# Patient Record
Sex: Male | Born: 1975 | Race: Black or African American | Hispanic: No | Marital: Married | Smoking: Never smoker
Health system: Southern US, Community
[De-identification: ages and names within clinical notes are randomized; demographics above are authoritative.]

## PROBLEM LIST (undated history)

## (undated) DIAGNOSIS — E669 Obesity, unspecified: Secondary | ICD-10-CM

## (undated) DIAGNOSIS — E739 Lactose intolerance, unspecified: Secondary | ICD-10-CM

## (undated) DIAGNOSIS — E785 Hyperlipidemia, unspecified: Secondary | ICD-10-CM

## (undated) DIAGNOSIS — I10 Essential (primary) hypertension: Secondary | ICD-10-CM

## (undated) DIAGNOSIS — R809 Proteinuria, unspecified: Secondary | ICD-10-CM

## (undated) DIAGNOSIS — G44209 Tension-type headache, unspecified, not intractable: Secondary | ICD-10-CM

## (undated) HISTORY — DX: Proteinuria, unspecified: R80.9

## (undated) HISTORY — DX: Essential (primary) hypertension: I10

## (undated) HISTORY — DX: Lactose intolerance, unspecified: E73.9

## (undated) HISTORY — DX: Hyperlipidemia, unspecified: E78.5

## (undated) HISTORY — DX: Tension-type headache, unspecified, not intractable: G44.209

## (undated) HISTORY — DX: Obesity, unspecified: E66.9

---

## 2010-08-21 ENCOUNTER — Encounter (INDEPENDENT_AMBULATORY_CARE_PROVIDER_SITE_OTHER): Payer: BLUE CROSS/BLUE SHIELD | Admitting: Family Medicine

## 2010-08-21 DIAGNOSIS — E669 Obesity, unspecified: Secondary | ICD-10-CM

## 2010-08-21 DIAGNOSIS — Z0000 Encounter for general adult medical examination without abnormal findings: Secondary | ICD-10-CM

## 2010-08-21 DIAGNOSIS — E739 Lactose intolerance, unspecified: Secondary | ICD-10-CM

## 2010-08-21 DIAGNOSIS — R51 Headache: Secondary | ICD-10-CM

## 2011-02-26 ENCOUNTER — Other Ambulatory Visit (INDEPENDENT_AMBULATORY_CARE_PROVIDER_SITE_OTHER): Payer: BLUE CROSS/BLUE SHIELD

## 2011-02-26 DIAGNOSIS — Z111 Encounter for screening for respiratory tuberculosis: Secondary | ICD-10-CM

## 2011-02-28 LAB — TB SKIN TEST: Induration: 0

## 2011-04-02 ENCOUNTER — Encounter: Payer: Self-pay | Admitting: Family Medicine

## 2012-03-04 ENCOUNTER — Encounter (HOSPITAL_COMMUNITY): Payer: Self-pay | Admitting: *Deleted

## 2012-03-04 ENCOUNTER — Emergency Department (HOSPITAL_COMMUNITY)
Admission: EM | Admit: 2012-03-04 | Discharge: 2012-03-04 | Disposition: A | Discharge status: Home / Self Care | Payer: BLUE CROSS/BLUE SHIELD | Attending: Emergency Medicine | Admitting: Emergency Medicine

## 2012-03-04 DIAGNOSIS — J029 Acute pharyngitis, unspecified: Secondary | ICD-10-CM | POA: Insufficient documentation

## 2012-03-04 DIAGNOSIS — E669 Obesity, unspecified: Secondary | ICD-10-CM | POA: Insufficient documentation

## 2012-03-04 MED ORDER — PENICILLIN G BENZATHINE 1200000 UNIT/2ML IM SUSP
1.2000 10*6.[IU] | Freq: Once | INTRAMUSCULAR | Status: AC
  Administered 2012-03-04: 1.2 10*6.[IU] via INTRAMUSCULAR
  Filled 2012-03-04: qty 2

## 2012-03-04 MED ORDER — DEXAMETHASONE SODIUM PHOSPHATE 10 MG/ML IJ SOLN
10.0000 mg | Freq: Once | INTRAMUSCULAR | Status: AC
  Administered 2012-03-04: 10 mg via INTRAMUSCULAR
  Filled 2012-03-04: qty 1

## 2012-03-04 MED ORDER — DIPHENHYDRAMINE HCL 50 MG/ML IJ SOLN
25.0000 mg | Freq: Once | INTRAMUSCULAR | Status: AC
  Administered 2012-03-04: 25 mg via INTRAMUSCULAR
  Filled 2012-03-04: qty 1

## 2012-03-04 NOTE — ED Provider Notes (Signed)
History     CSN: [id number]  Arrival date & time 03/04/12  4098   First MD Initiated Contact with Patient 03/04/12 0539      Chief Complaint  Patient presents with  . Sore Throat    (Consider location/radiation/quality/duration/timing/severity/associated sxs/prior treatment) Patient is a 36 y.o. male presenting with pharyngitis. The history is provided by the patient.  Sore Throat This is a new problem. The current episode started 2 days ago. The problem occurs constantly. The problem has not changed since onset.The symptoms are aggravated by swallowing.    Past Medical History  Diagnosis Date  . Obesity   . Lactose intolerance   . Tension headache     History reviewed. No pertinent past surgical history.  No family history on file.  History  Substance Use Topics  . Smoking status: Never Smoker   . Smokeless tobacco: Not on file  . Alcohol Use: Yes     occasionally      Review of Systems  HENT: Negative for neck pain and neck stiffness.   All other systems reviewed and are negative.    Allergies  Review of patient's allergies indicates no known allergies.  Home Medications  No current outpatient prescriptions on file.  BP 158/94  Pulse 86  Temp 98.1 F (36.7 C) (Oral)  Resp 18  SpO2 97%  Physical Exam  Vitals reviewed. Constitutional: He is oriented to person, place, and time. He appears well-developed and well-nourished.  HENT:  Head: Normocephalic and atraumatic.  Eyes: Conjunctivae normal are normal. Pupils are equal, round, and reactive to light.  Neck: Normal range of motion. Neck supple. No spinous process tenderness and no muscular tenderness present. Normal range of motion present. No Brudzinski's sign and no Kernig's sign noted.  Cardiovascular: Normal rate, regular rhythm, normal heart sounds and intact distal pulses.   Pulmonary/Chest: Effort normal and breath sounds normal.  Abdominal: Soft. Bowel sounds are normal.  Neurological: He  is alert and oriented to person, place, and time.  Skin: Skin is warm and dry.  Psychiatric: He has a normal mood and affect. His behavior is normal. Judgment and thought content normal.    ED Course  Procedures (including critical care time)   Labs Reviewed  RAPID STREP SCREEN   No results found.   1. Pharyngitis       MDM  + pharyngitis.  No difficulty phonating or swallowing.  No meningeal sxs.  No uvular deviation.  Will abx,  Steriods,  Dc to fu ret new/worsening sxs        Blakleigh Straw Lytle Michaels, MD 03/04/12 0600

## 2012-03-04 NOTE — ED Notes (Signed)
Pt c/o sore throat, headache and fever of102 since Sunday (presently afebrile) Pain is unrelieved by ibuprofen and tylenol.  Pt states he started coughing today.

## 2012-06-01 ENCOUNTER — Ambulatory Visit (INDEPENDENT_AMBULATORY_CARE_PROVIDER_SITE_OTHER): Payer: BLUE CROSS/BLUE SHIELD | Admitting: Medical

## 2012-06-01 ENCOUNTER — Encounter: Payer: Self-pay | Admitting: Medical

## 2012-06-01 VITALS — BP 170/100 | HR 60 | Temp 97.7°F | Resp 16 | Wt 300.0 lb

## 2012-06-01 DIAGNOSIS — H109 Unspecified conjunctivitis: Secondary | ICD-10-CM

## 2012-06-01 DIAGNOSIS — R030 Elevated blood-pressure reading, without diagnosis of hypertension: Secondary | ICD-10-CM

## 2012-06-01 MED ORDER — ERYTHROMYCIN 5 MG/GM OP OINT: TOPICAL_OINTMENT | Freq: Four times a day (QID) | OPHTHALMIC | Status: DC

## 2012-06-01 NOTE — Patient Instructions (Signed)
Conjunctivitis Conjunctivitis is commonly called "pink eye." Conjunctivitis can be caused by bacterial or viral infection, allergies, or injuries. There is usually redness of the lining of the eye, itching, discomfort, and sometimes discharge. There may be deposits of matter along the eyelids. A viral infection usually causes a watery discharge, while a bacterial infection causes a yellowish, thick discharge. Pink eye is very contagious and spreads by direct contact. You may be given antibiotic eyedrops as part of your treatment. Before using your eye medicine, remove all drainage from the eye by washing gently with warm water and cotton balls. Continue to use the medication until you have awakened 2 mornings in a row without discharge from the eye. Do not rub your eye. This increases the irritation and helps spread infection. Use separate towels from other household members. Wash your hands with soap and water before and after touching your eyes. Use cold compresses to reduce pain and sunglasses to relieve irritation from light. Do not wear contact lenses or wear eye makeup until the infection is gone. SEEK MEDICAL CARE IF:   Your symptoms are not better after 3 days of treatment.  You have increased pain or trouble seeing.  The outer eyelids become very red or swollen. Document Released: 06/06/2004 Document Revised: 07/22/2011 Document Reviewed: 04/29/2005 Exit[employer] Patient Information 2013 [employer].  

## 2012-06-01 NOTE — Progress Notes (Signed)
Subjective: Here for right eye issue x 2 days.  He reports redness, swelling, mucous drainage, itching, crusting and matted all day long, very crusted.  Having to pry open in the morning.  No sick contacts with similar.  Child is in daycare, but they haven't had similar.  Using moist compresses.  Vision is blurry, but otherwise fine.   No other aggravating or relieving factors.  No other c/o.  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  Past Medical History  Diagnosis Date  . Obesity   . Lactose intolerance   . Tension headache    Review of Systems Constitutional: -fever, -chills, -sweats, -fatigue ENT: -runny nose, -ear pain, -sore throat Cardiology:  -chest pain, -palpitations, -edema Respiratory: -cough, -shortness of breath, -wheezing Gastroenterology: -abdominal pain, -nausea, -vomiting, -diarrhea, -constipation  Urology: -dysuria, -difficulty urinating, -hematuria, -urinary frequency, -urgency Neurology: -headache, -weakness, -tingling, -numbness   Objective: Gen: wd, wn, nad Skin; warm, dry Eyes, right conjunctiva injected, crusting of eyelids, no eye lid swelling or erythema, PERRLA, EOMi, left eye normal exam ENT: nares patent, no turbinate swelling or discharge, TMs pearly, pharynx normal Neck: supple, nontender, no mass or thyromegaly, no lymphadenopathy   Assessment: Encounter Diagnoses  Name Primary?  . Elevated blood pressure reading without diagnosis of hypertension Yes  . Conjunctivitis    Elevated BP today and on another prior visit.  Advised he return soon to discuss and possibly may need therapy.  Discussed stress reduction  conjunctivitis - discussed diagnosis and treatment, precautions, hand washing.   Begin erythromycin ointment, moist compresses, gave note for work.     Return in 1-2wk recheck on BP.

## 2013-02-10 ENCOUNTER — Ambulatory Visit (INDEPENDENT_AMBULATORY_CARE_PROVIDER_SITE_OTHER): Payer: BC Managed Care – PPO | Admitting: Medical

## 2013-02-10 ENCOUNTER — Encounter: Payer: Self-pay | Admitting: Medical

## 2013-02-10 VITALS — BP 128/80 | HR 68 | Temp 98.2°F | Resp 16 | Ht 72.0 in | Wt 304.0 lb

## 2013-02-10 DIAGNOSIS — E669 Obesity, unspecified: Secondary | ICD-10-CM

## 2013-02-10 DIAGNOSIS — Z0000 Encounter for general adult medical examination without abnormal findings: Secondary | ICD-10-CM

## 2013-02-10 LAB — CBC WITH DIFFERENTIAL/PLATELET
Basophils Absolute: 0 10*3/uL (ref 0.0–0.1)
Basophils Relative: 0 % (ref 0–1)
Eosinophils Absolute: 0.2 10*3/uL (ref 0.0–0.7)
Eosinophils Relative: 2 % (ref 0–5)
Hemoglobin: 13.5 g/dL (ref 13.0–17.0)
MCH: 29.3 pg (ref 26.0–34.0)
MCV: 83.1 fL (ref 78.0–100.0)
Monocytes Relative: 9 % (ref 3–12)
Neutrophils Relative %: 44 % (ref 43–77)
Platelets: 248 10*3/uL (ref 150–400)
RBC: 4.61 MIL/uL (ref 4.22–5.81)
RDW: 14.7 % (ref 11.5–15.5)

## 2013-02-10 LAB — COMPREHENSIVE METABOLIC PANEL
ALT: 14 U/L (ref 0–53)
Albumin: 4.1 g/dL (ref 3.5–5.2)
Alkaline Phosphatase: 65 U/L (ref 39–117)
BUN: 15 mg/dL (ref 6–23)
CO2: 28 mEq/L (ref 19–32)
Calcium: 9.5 mg/dL (ref 8.4–10.5)
Potassium: 3.7 mEq/L (ref 3.5–5.3)
Sodium: 137 mEq/L (ref 135–145)
Total Bilirubin: 0.5 mg/dL (ref 0.3–1.2)
Total Protein: 7.9 g/dL (ref 6.0–8.3)

## 2013-02-10 LAB — POCT URINALYSIS DIPSTICK
Bilirubin, UA: NEGATIVE
Glucose, UA: NEGATIVE
Ketones, UA: NEGATIVE
Leukocytes, UA: NEGATIVE
Spec Grav, UA: 1.02
Urobilinogen, UA: NEGATIVE
pH, UA: 5

## 2013-02-10 LAB — TSH: TSH: 0.957 u[IU]/mL (ref 0.350–4.500)

## 2013-02-10 LAB — LIPID PANEL: LDL Cholesterol: 144 mg/dL — ABNORMAL HIGH (ref 0–99)

## 2013-02-10 MED ORDER — LORCASERIN HCL 10 MG PO TABS: 1.0000 | ORAL_TABLET | Freq: Two times a day (BID) | ORAL | Status: DC

## 2013-02-10 NOTE — Progress Notes (Signed)
Subjective:   HPI  Jon Hansen is a 37 y.o. male who presents for a complete physical.  Preventative care: Last ophthalmology visit:n/a Last dental visit:n/a Last colonoscopy:n/a Last prostate exam: n/a Last EKG:7 to 8 years ago Last labs: 02/2012  Prior vaccinations: TD or Tdap:6 years ago for his job Influenza:no to flu vaccine Pneumococcal:n/a Shingles/Zostavax:n/a  Advanced directive:n/a Health care power of attorney:n/a Living will:n/a  Concerns: Few months ago had a few episodes of chest pain or palpitations lasting for seconds without associated symptoms, but this was with stressful job and eating worse, drinking more caffeine.  Since leaving the job, cutting back on caffeine, has had no more symptoms.   Reviewed their medical, surgical, family, social, medication, and allergy history and updated chart as appropriate.  Past Medical History  Diagnosis Date  . Obesity   . Lactose intolerance   . Tension headache     resolved; prior with stressful job    History reviewed. No pertinent past surgical history.  History   Social History  . Marital Status: Married    Spouse Name: N/A    Number of Children: N/A  . Years of Education: N/A   Occupational History  . Not on file.   Social History Main Topics  . Smoking status: Never Smoker   . Smokeless tobacco: Not on file  . Alcohol Use: 0.6 oz/week    1 Shots of liquor per week     Comment: occasionally  . Drug Use: No  . Sexual Activity: Not on file   Other Topics Concern  . Not on file   Social History Narrative   High school science teacher, limited exercise - walking, parking further from entrances;  Married - no biological children.  Christian    Family History  Problem Relation Age of Onset  . Diabetes Mother   . Diabetes Father   . Hypertension Maternal Grandmother   . Cancer Neg Hx   . Stroke Neg Hx   . Heart disease Neg Hx     Current outpatient prescriptions:ibuprofen  (ADVIL,MOTRIN) 200 MG tablet, Take 200-600 mg by mouth every 6 (six) hours as needed. pain, Disp: , Rfl:   No Known Allergies     Review of Systems Constitutional: -fever, -chills, -sweats, -unexpected weight change, -decreased appetite, -fatigue Allergy: -sneezing, -itching, -congestion Dermatology: -changing moles, --rash, -lumps ENT: -runny nose, -ear pain, -sore throat, -hoarseness, -sinus pain, -teeth pain, - ringing in ears, -hearing loss, -nosebleeds Cardiology: -chest pain, -palpitations, -swelling, -difficulty breathing when lying flat, -waking up short of breath Respiratory: -cough, -shortness of breath, -difficulty breathing with exercise or exertion, -wheezing, -coughing up blood Gastroenterology: -abdominal pain, -nausea, -vomiting, -diarrhea, -constipation, -blood in stool, -changes in bowel movement, -difficulty swallowing or eating Hematology: -bleeding, -bruising  Musculoskeletal: -joint aches, -muscle aches, -joint swelling, -back pain, -neck pain, -cramping, -changes in gait Ophthalmology: denies vision changes, eye redness, itching, discharge Urology: -burning with urination, -difficulty urinating, -blood in urine, -urinary frequency, -urgency, -incontinence Neurology: -headache, -weakness, +tingling, -numbness, -memory loss, -falls, -dizziness Psychology: -depressed mood, -agitation, -sleep problems     Objective:   Physical Exam  BP 128/80  Pulse 68  Temp(Src) 98.2 F (36.8 C) (Oral)  Resp 16  Ht 6' (1.829 m)  Wt 304 lb (137.893 kg)  BMI 41.22 kg/m2  General appearance: alert, no distress, WD/WN, AA male Skin: few scattered skin tags, no other worrisome lesions HEENT: normocephalic, conjunctiva/corneas normal, sclerae anicteric, PERRLA, EOMi, nares patent, no discharge or erythema, pharynx  normal Oral cavity: MMM, tongue normal, teeth in good repair Neck: supple, no lymphadenopathy, no thyromegaly, no masses, normal ROM, no bruits Chest: non tender,  normal shape and expansion Heart: RRR, normal S1, S2, no murmurs Lungs: CTA bilaterally, no wheezes, rhonchi, or rales Abdomen: +bs, soft, non tender, non distended, no masses, no hepatomegaly, no splenomegaly, no bruits Back: non tender, normal ROM, no scoliosis Musculoskeletal: upper extremities non tender, no obvious deformity, normal ROM throughout, lower extremities non tender, no obvious deformity, normal ROM throughout Extremities: no edema, no cyanosis, no clubbing Pulses: 2+ symmetric, upper and lower extremities, normal cap refill Neurological: alert, oriented x 3, CN2-12 intact, strength normal upper extremities and lower extremities, sensation normal throughout, DTRs 2+ throughout, no cerebellar signs, gait normal Psychiatric: normal affect, behavior normal, pleasant  GU: normal male external genitalia, circumcised, brown linear flat macular streak down mid scrotum unchanged for years per pt, nontender, no masses, no hernia, no lymphadenopathy Rectal: deferred   Assessment and Plan :      Encounter Diagnoses  Name Primary?  . Routine general medical examination at a health care facility Yes  . Obesity, unspecified     Physical exam - discussed healthy lifestyle, diet, exercise, preventative care, vaccinations, and addressed their concerns.  See dentist soon.  Completed his foster parent forms. He has had 2 children prior that were ultimately reunited with their siblings.  They are activity seeking children now.  Obesity -  Discussed risks of obesity, prior elevated BP without hypertension, discussed healthy diet, incresaed exercising, discussed tracking diet with smart phone app, begin belviq to help aid weight loss.  Discussed risks/benefits of medication.  Recheck 94mo.  Follow-up for fasting labs.

## 2013-02-10 NOTE — Patient Instructions (Signed)
Begin Belviq appetite suppressant to help with weight loss efforts.  Use My Fitness Pal to track diet.  Limit calories to 1800 calories per day.  Eat 4-6 small portions daily.  Increase exercise frequency.  Recheck in 1-2 months.  Call sooner if any problems.   See dentist soon.    Preventative Care for Adults, Male       REGULAR HEALTH EXAMS:  A routine yearly physical is a good way to check in with your primary care provider about your health and preventive screening. It is also an opportunity to share updates about your health and any concerns you have, and receive a thorough all-over exam.   Most health insurance companies pay for at least some preventative services.  Check with your health plan for specific coverages.  WHAT PREVENTATIVE SERVICES DO MEN NEED?  Adult men should have their weight and blood pressure checked regularly.   Men age 34 and older should have their cholesterol levels checked regularly.  Beginning at age 73 and continuing to age 29, men should be screened for colorectal cancer.  Certain people should may need continued testing until age 66.  Other cancer screening may include exams for testicular and prostate cancer.  Updating vaccinations is part of preventative care.  Vaccinations help protect against diseases such as the flu.  Lab tests are generally done as part of preventative care to screen for anemia and blood disorders, to screen for problems with the kidneys and liver, to screen for bladder problems, to check blood sugar, and to check your cholesterol level.  Preventative services generally include counseling about diet, exercise, avoiding tobacco, drugs, excessive alcohol consumption, and sexually transmitted infections.    GENERAL RECOMMENDATIONS FOR GOOD HEALTH:  Healthy diet:  Eat a variety of foods, including fruit, vegetables, animal or vegetable protein, such as meat, fish, chicken, and eggs, or beans, lentils, tofu, and grains, such as  rice.  Drink plenty of water daily.  Decrease saturated fat in the diet, avoid lots of red meat, processed foods, sweets, fast foods, and fried foods.  Exercise:  Aerobic exercise helps maintain good heart health. At least 30-40 minutes of moderate-intensity exercise is recommended. For example, a brisk walk that increases your heart rate and breathing. This should be done on most days of the week.   Find a type of exercise or a variety of exercises that you enjoy so that it becomes a part of your daily life.  Examples are running, walking, swimming, water aerobics, and biking.  For motivation and support, explore group exercise such as aerobic class, spin class, Zumba, Yoga,or  martial arts, etc.    Set exercise goals for yourself, such as a certain weight goal, walk or run in a race such as a 5k walk/run.  Speak to your primary care provider about exercise goals.  Disease prevention:  If you smoke or chew tobacco, find out from your caregiver how to quit. It can literally save your life, no matter how long you have been a tobacco user. If you do not use tobacco, never begin.   Maintain a healthy diet and normal weight. Increased weight leads to problems with blood pressure and diabetes.   The Body Mass Index or BMI is a way of measuring how much of your body is fat. Having a BMI above 27 increases the risk of heart disease, diabetes, hypertension, stroke and other problems related to obesity. Your caregiver can help determine your BMI and based on it develop an  exercise and dietary program to help you achieve or maintain this important measurement at a healthful level.  High blood pressure causes heart and blood vessel problems.  Persistent high blood pressure should be treated with medicine if weight loss and exercise do not work.   Fat and cholesterol leaves deposits in your arteries that can block them. This causes heart disease and vessel disease elsewhere in your body.  If your  cholesterol is found to be high, or if you have heart disease or certain other medical conditions, then you may need to have your cholesterol monitored frequently and be treated with medication.   Ask if you should have a stress test if your history suggests this. A stress test is a test done on a treadmill that looks for heart disease. This test can find disease prior to there being a problem.  Avoid drinking alcohol in excess (more than two drinks per day).  Avoid use of street drugs. Do not share needles with anyone. Ask for professional help if you need assistance or instructions on stopping the use of alcohol, cigarettes, and/or drugs.  Brush your teeth twice a day with fluoride toothpaste, and floss once a day. Good oral hygiene prevents tooth decay and gum disease. The problems can be painful, unattractive, and can cause other health problems. Visit your dentist for a routine oral and dental check up and preventive care every 6-12 months.   Look at your skin regularly.  Use a mirror to look at your back. Notify your caregivers of changes in moles, especially if there are changes in shapes, colors, a size larger than a pencil eraser, an irregular border, or development of new moles.  Safety:  Use seatbelts 100% of the time, whether driving or as a passenger.  Use safety devices such as hearing protection if you work in environments with loud noise or significant background noise.  Use safety glasses when doing any work that could send debris in to the eyes.  Use a helmet if you ride a bike or motorcycle.  Use appropriate safety gear for contact sports.  Talk to your caregiver about gun safety.  Use sunscreen with a SPF (or skin protection factor) of 15 or greater.  Lighter skinned people are at a greater risk of skin cancer. Don't forget to also wear sunglasses in order to protect your eyes from too much damaging sunlight. Damaging sunlight can accelerate cataract formation.   Practice safe  sex. Use condoms. Condoms are used for birth control and to help reduce the spread of sexually transmitted infections (or STIs).  Some of the STIs are gonorrhea (the clap), chlamydia, syphilis, trichomonas, herpes, HPV (human papilloma virus) and HIV (human immunodeficiency virus) which causes AIDS. The herpes, HIV and HPV are viral illnesses that have no cure. These can result in disability, cancer and death.   Keep carbon monoxide and smoke detectors in your home functioning at all times. Change the batteries every 6 months or use a model that plugs into the wall.   Vaccinations:  Stay up to date with your tetanus shots and other required immunizations. You should have a booster for tetanus every 10 years. Be sure to get your flu shot every year, since 5%-20% of the [place] population comes down with the flu. The flu vaccine changes each year, so being vaccinated once is not enough. Get your shot in the fall, before the flu season peaks.   Other vaccines to consider:  Pneumococcal vaccine to protect against certain  types of pneumonia.  This is normally recommended for adults age 45 or older.  However, adults younger than 35 years old with certain underlying conditions such as diabetes, heart or lung disease should also receive the vaccine.  Shingles vaccine to protect against Varicella Zoster if you are older than age 3, or younger than 35 years old with certain underlying illness.  Hepatitis A vaccine to protect against a form of infection of the liver by a virus acquired from food.  Hepatitis B vaccine to protect against a form of infection of the liver by a virus acquired from blood or body fluids, particularly if you work in health care.  If you plan to travel internationally, check with your local health department for specific vaccination recommendations.  Cancer Screening:  Most routine colon cancer screening begins at the age of 40. On a yearly basis, doctors may provide special easy to  use take-home tests to check for hidden blood in the stool. Sigmoidoscopy or colonoscopy can detect the earliest forms of colon cancer and is life saving. These tests use a small camera at the end of a tube to directly examine the colon. Speak to your caregiver about this at age 79, when routine screening begins (and is repeated every 5 years unless early forms of pre-cancerous polyps or small growths are found).   At the age of 89 men usually start screening for prostate cancer every year. Screening may begin at a younger age for those with higher risk. Those at higher risk include African-Americans or having a family history of prostate cancer. There are two types of tests for prostate cancer:   Prostate-specific antigen (PSA) testing. Recent studies raise questions about prostate cancer using PSA and you should discuss this with your caregiver.   Digital rectal exam (in which your doctor's lubricated and gloved finger feels for enlargement of the prostate through the anus).   Screening for testicular cancer.  Do a monthly exam of your testicles. Gently roll each testicle between your thumb and fingers, feeling for any abnormal lumps. The best time to do this is after a hot shower or bath when the tissues are looser. Notify your caregivers of any lumps, tenderness or changes in size or shape immediately.

## 2013-04-14 ENCOUNTER — Other Ambulatory Visit (INDEPENDENT_AMBULATORY_CARE_PROVIDER_SITE_OTHER): Payer: BC Managed Care – PPO

## 2013-04-14 DIAGNOSIS — Z111 Encounter for screening for respiratory tuberculosis: Secondary | ICD-10-CM

## 2013-04-16 LAB — TB SKIN TEST: TB Skin Test: NEGATIVE

## 2013-04-19 ENCOUNTER — Other Ambulatory Visit: Payer: Self-pay

## 2013-04-20 ENCOUNTER — Other Ambulatory Visit (INDEPENDENT_AMBULATORY_CARE_PROVIDER_SITE_OTHER): Payer: BC Managed Care – PPO

## 2013-04-20 DIAGNOSIS — Z23 Encounter for immunization: Secondary | ICD-10-CM

## 2014-03-10 ENCOUNTER — Ambulatory Visit (INDEPENDENT_AMBULATORY_CARE_PROVIDER_SITE_OTHER): Payer: BC Managed Care – PPO | Admitting: Family Medicine

## 2014-03-10 ENCOUNTER — Ambulatory Visit: Payer: BC Managed Care – PPO | Admitting: Family Medicine

## 2014-03-10 ENCOUNTER — Encounter: Payer: Self-pay | Admitting: Family Medicine

## 2014-03-10 VITALS — BP 174/100 | HR 72 | Ht 71.0 in | Wt 302.0 lb

## 2014-03-10 DIAGNOSIS — R809 Proteinuria, unspecified: Secondary | ICD-10-CM

## 2014-03-10 DIAGNOSIS — R7989 Other specified abnormal findings of blood chemistry: Secondary | ICD-10-CM

## 2014-03-10 DIAGNOSIS — I10 Essential (primary) hypertension: Secondary | ICD-10-CM

## 2014-03-10 DIAGNOSIS — E7800 Pure hypercholesterolemia, unspecified: Secondary | ICD-10-CM

## 2014-03-10 DIAGNOSIS — R748 Abnormal levels of other serum enzymes: Secondary | ICD-10-CM

## 2014-03-10 LAB — POCT URINALYSIS DIPSTICK
Bilirubin, UA: NEGATIVE
Glucose, UA: NEGATIVE
KETONES UA: NEGATIVE
Leukocytes, UA: NEGATIVE
NITRITE UA: NEGATIVE
PH UA: 6
Spec Grav, UA: 1.015
Urobilinogen, UA: NEGATIVE

## 2014-03-10 LAB — COMPREHENSIVE METABOLIC PANEL
ALK PHOS: 63 U/L (ref 39–117)
ALT: 15 U/L (ref 0–53)
AST: 15 U/L (ref 0–37)
Albumin: 3.9 g/dL (ref 3.5–5.2)
BILIRUBIN TOTAL: 0.5 mg/dL (ref 0.2–1.2)
BUN: 13 mg/dL (ref 6–23)
CO2: 28 mEq/L (ref 19–32)
CREATININE: 1.44 mg/dL — AB (ref 0.50–1.35)
Calcium: 8.8 mg/dL (ref 8.4–10.5)
Chloride: 102 mEq/L (ref 96–112)
Glucose, Bld: 98 mg/dL (ref 70–99)
Potassium: 3.7 mEq/L (ref 3.5–5.3)
Sodium: 138 mEq/L (ref 135–145)
Total Protein: 7.2 g/dL (ref 6.0–8.3)

## 2014-03-10 MED ORDER — AMLODIPINE BESYLATE 10 MG PO TABS: 10.0000 mg | ORAL_TABLET | Freq: Every day | ORAL | Status: DC

## 2014-03-10 NOTE — Patient Instructions (Signed)
Hypertension °Hypertension, commonly called high blood pressure, is when the force of blood pumping through your arteries is too strong. Your arteries are the blood vessels that carry blood from your heart throughout your body. A blood pressure reading consists of a higher number over a lower number, such as 110/72. The higher number (systolic) is the pressure inside your arteries when your heart pumps. The lower number (diastolic) is the pressure inside your arteries when your heart relaxes. Ideally you want your blood pressure below 120/80. °Hypertension forces your heart to work harder to pump blood. Your arteries may become narrow or stiff. Having hypertension puts you at risk for heart disease, stroke, and other problems.  °RISK FACTORS °Some risk factors for high blood pressure are controllable. Others are not.  °Risk factors you cannot control include:  °· Race. You may be at higher risk if you are African American. °· Age. Risk increases with age. °· Gender. Men are at higher risk than women before age 45 years. After age 65, women are at higher risk than men. °Risk factors you can control include: °· Not getting enough exercise or physical activity. °· Being overweight. °· Getting too much fat, sugar, calories, or salt in your diet. °· Drinking too much alcohol. °SIGNS AND SYMPTOMS °Hypertension does not usually cause signs or symptoms. Extremely high blood pressure (hypertensive crisis) may cause headache, anxiety, shortness of breath, and nosebleed. °DIAGNOSIS  °To check if you have hypertension, your health care provider will measure your blood pressure while you are seated, with your arm held at the level of your heart. It should be measured at least twice using the same arm. Certain conditions can cause a difference in blood pressure between your right and left arms. A blood pressure reading that is higher than normal on one occasion does not mean that you need treatment. If one blood pressure reading  is high, ask your health care provider about having it checked again. °TREATMENT  °Treating high blood pressure includes making lifestyle changes and possibly taking medicine. Living a healthy lifestyle can help lower high blood pressure. You may need to change some of your habits. °Lifestyle changes may include: °· Following the DASH diet. This diet is high in fruits, vegetables, and whole grains. It is low in salt, red meat, and added sugars. °· Getting at least 2½ hours of brisk physical activity every week. °· Losing weight if necessary. °· Not smoking. °· Limiting alcoholic beverages. °· Learning ways to reduce stress. ° If lifestyle changes are not enough to get your blood pressure under control, your health care provider may prescribe medicine. You may need to take more than one. Work closely with your health care provider to understand the risks and benefits. °HOME CARE INSTRUCTIONS °· Have your blood pressure rechecked as directed by your health care provider.   °· Take medicines only as directed by your health care provider. Follow the directions carefully. Blood pressure medicines must be taken as prescribed. The medicine does not work as well when you skip doses. Skipping doses also puts you at risk for problems.   °· Do not smoke.   °· Monitor your blood pressure at home as directed by your health care provider.  °SEEK MEDICAL CARE IF:  °· You think you are having a reaction to medicines taken. °· You have recurrent headaches or feel dizzy. °· You have swelling in your ankles. °· You have trouble with your vision. °SEEK IMMEDIATE MEDICAL CARE IF: °· You develop a severe headache or confusion. °·   You have unusual weakness, numbness, or feel faint. °· You have severe chest or abdominal pain. °· You vomit repeatedly. °· You have trouble breathing. °MAKE SURE YOU:  °· Understand these instructions. °· Will watch your condition. °· Will get help right away if you are not doing well or get worse. °Document  Released: 04/29/2005 Document Revised: 09/13/2013 Document Reviewed: 02/19/2013 °Exit[employer]® Patient Information ©2015 [employer]. This information is not intended to replace advice given to you by your health care provider. Make sure you discuss any questions you have with your health care provider. °Low-Sodium Eating Plan °Sodium raises blood pressure and causes water to be held in the body. Getting less sodium from food will help lower your blood pressure, reduce any swelling, and protect your heart, liver, and kidneys. We get sodium by adding salt (sodium chloride) to food. Most of our sodium comes from canned, boxed, and frozen foods. Restaurant foods, fast foods, and pizza are also very high in sodium. Even if you take medicine to lower your blood pressure or to reduce fluid in your body, getting less sodium from your food is important. °WHAT IS MY PLAN? °Most people should limit their sodium intake to 2,300 mg a day. Your health care provider recommends that you limit your sodium intake to __________ a day.  °WHAT DO I NEED TO KNOW ABOUT THIS EATING PLAN? °For the low-sodium eating plan, you will follow these general guidelines: °· Choose foods with a % Daily Value for sodium of less than 5% (as listed on the food label).   °· Use salt-free seasonings or herbs instead of table salt or sea salt.   °· Check with your health care provider or pharmacist before using salt substitutes.   °· Eat fresh foods. °· Eat more vegetables and fruits. °· Limit canned vegetables. If you do use them, rinse them well to decrease the sodium.   °· Limit cheese to 1 oz (28 g) per day.    °· Eat lower-sodium products, often labeled as "lower sodium" or "no salt added." °· Avoid foods that contain monosodium glutamate (MSG). MSG is sometimes added to Chinese food and some canned foods.   °· Check food labels (Nutrition Facts labels) on foods to learn how much sodium is in one serving. °· Eat more home-cooked food and less  restaurant, buffet, and fast food.  °· When eating at a restaurant, ask that your food be prepared with less salt or none, if possible.   °HOW DO I READ FOOD LABELS FOR SODIUM INFORMATION? °The Nutrition Facts label lists the amount of sodium in one serving of the food. If you eat more than one serving, you must multiply the listed amount of sodium by the number of servings. °Food labels may also identify foods as: °· Sodium free--Less than 5 mg in a serving. °· Very low sodium--35 mg or less in a serving. °· Low sodium--140 mg or less in a serving. °· Light in sodium--50% less sodium in a serving. For example, if a food that usually has 300 mg of sodium is changed to become light in sodium, it will have 150 mg of sodium. °· Reduced sodium--25% less sodium in a serving. For example, if a food that usually has 400 mg of sodium is changed to reduced sodium, it will have 300 mg of sodium. °WHAT FOODS CAN I EAT? °Grains  °Low-sodium cereals, including oats, puffed wheat and rice, and shredded wheat cereals. Low-sodium crackers. Unsalted rice and pasta. Lower-sodium bread.  °Vegetables  °Frozen or fresh vegetables. Low-sodium or reduced-sodium canned vegetables. Low-sodium   or reduced-sodium tomato sauce and paste. Low-sodium or reduced-sodium tomato and vegetable juices.  °Fruits  °Fresh, frozen, and canned fruit. Fruit juice.  °Meat and Other Protein Products  °Low-sodium canned tuna and salmon. Fresh or frozen meat, poultry, seafood, and fish. Lamb. Unsalted nuts. Dried beans, peas, and lentils without added salt. Unsalted canned beans. Homemade soups without salt. Eggs.  °Dairy  °Milk. Soy milk. Ricotta cheese. Low-sodium or reduced-sodium cheeses. Yogurt.  °Condiments  °Fresh and dried herbs and spices. Salt-free seasonings. Onion and garlic powders. Low-sodium varieties of mustard and ketchup. Lemon juice.  °Fats and Oils   °Reduced-sodium salad dressings. Unsalted butter.   °Other  °Unsalted popcorn and pretzels.    °The items listed above may not be a complete list of recommended foods or beverages. Contact your dietitian for more options. °WHAT FOODS ARE NOT RECOMMENDED? °Grains  °Instant hot cereals. Bread stuffing, pancake, and biscuit mixes. Croutons. Seasoned rice or pasta mixes. Noodle soup cups. Boxed or frozen macaroni and cheese. Self-rising flour. Regular salted crackers. °Vegetables  °Regular canned vegetables. Regular canned tomato sauce and paste. Regular tomato and vegetable juices. Frozen vegetables in sauces. Salted french fries. Olives. Pickles. Relishes. Sauerkraut. Salsa. °Meat and Other Protein Products  °Salted, canned, smoked, spiced, or pickled meats, seafood, or fish. Bacon, ham, sausage, hot dogs, corned beef, chipped beef, and packaged luncheon meats. Salt pork. Jerky. Pickled herring. Anchovies, regular canned tuna, and sardines. Salted nuts. °Dairy  °Processed cheese and cheese spreads. Cheese curds. Blue cheese and cottage cheese. Buttermilk.  °Condiments  °Onion and garlic salt, seasoned salt, table salt, and sea salt. Canned and packaged gravies. Worcestershire sauce. Tartar sauce. Barbecue sauce. Teriyaki sauce. Soy sauce, including reduced sodium. Steak sauce. Fish sauce. Oyster sauce. Cocktail sauce. Horseradish. Regular ketchup and mustard. Meat flavorings and tenderizers. Bouillon cubes. Hot sauce. Tabasco sauce. Marinades. Taco seasonings. Relishes. °Fats and Oils   °Regular salad dressings. Salted butter. Margarine. Ghee. Bacon fat.  °Other  °Potato and tortilla chips. Corn chips and puffs. Salted popcorn and pretzels. Canned or dried soups. Pizza. Frozen entrees and pot pies.   °The items listed above may not be a complete list of foods and beverages to avoid. Contact your dietitian for more information. °Document Released: 10/19/2001 Document Revised: 05/04/2013 Document Reviewed: 03/03/2013 °[employer]® Patient Information ©2015 [employer]. This information is not intended to replace  advice given to you by your health care provider. Make sure you discuss any questions you have with your health care provider. ° °

## 2014-03-10 NOTE — Progress Notes (Signed)
Chief Complaint  Patient presents with  . Hypertension    [facility]ju[facility]. Had elevated bp-PA wanted to treat, but patient preference was to come here.    Today, he went to the Urgent Care for left eye being dry, itchy and some crusting in the morning recently.  He was noted to have very high blood pressure and presents here to follow up and discuss. (They offered to treat, but he felt more comfortable coming here).  He normally sees Vincenza HewsShane, last seen for a physical a year ago.  He has been having headaches that he attributes to right sided neck pain, related to his bed. He wakes up with pain frequently (not daily, related to sleep position) and pain improves later in the morning without any treatment.    He has checked his BP on machine at pharmacy about every couple of months.  His numbers had been very high, but thought it was related to the cuff being much too small.    Has not been taking any decongestants; denies drug use; no stimulant OTC medications.  Had [employer] coffee this am (usually only rarely has coffee).  He has just slight pain at his right posterior neck, no other pain.  He is complaining of a sharp pin-like pain at his left chest.  He notices it when he wakes up in the morning, and every few hours throughout the day he gets short-lived twinges of pain. He sometimes gets heartburn, belching. He has no exertional chest pain or shortness of breath.  Minimal headache now--mostly at right posterior neck muscle. Feels a slight discomfort at his right temple.  Currently without chest pain.  Past Medical History  Diagnosis Date  . Obesity   . Lactose intolerance   . Tension headache     resolved; prior with stressful job   History reviewed. No pertinent past surgical history.  History   Social History  . Marital Status: Married    Spouse Name: N/A    Number of Children: N/A  . Years of Education: N/A   Occupational History  . works in Multimedia programmermedical lab     Social History Main Topics  . Smoking status: Never Smoker   . Smokeless tobacco: Never Used  . Alcohol Use: 0.6 oz/week    1 Shots of liquor per week     Comment: occasionally  . Drug Use: No  . Sexual Activity: Not on file   Other Topics Concern  . Not on file   Social History Narrative   Works in a medical lab. (Former--High school Administrator, artsscience teacher), limited exercise - walking, parking further from entrances;  Married - no biological children.  Christian   Current Outpatient Prescriptions on File Prior to Visit  Medication Sig Dispense Refill  . ibuprofen (ADVIL,MOTRIN) 200 MG tablet Take 200-600 mg by mouth every 6 (six) hours as needed. pain       No current facility-administered medications on file prior to visit.   He was prescribed an eye drop from urgent care, but hasn't filled it yet.  No Known Allergies  ROS:  Denies fevers, chills, dizziness (occasionally, rare). Denies nausea, vomiting, diarrhea, abdominal pain, no urinary complaints. Denies joint pains (just right neck).  Denies any edema. Denies bleeding, bruising, rashes Denies URI symptoms (just the eye complaints), no cough, shortness of breath. See HPI.  PHYSICAL EXAM: BP 192/114  Pulse 72  Ht 5\' 11"  (1.803 m)  Wt 302 lb (136.986 kg)  BMI 42.14  kg/m2 174/100 Mildly anxious appearing male (who appeared more relaxed later in visit), accompanied by his wife HEENT:  Normocephalic, atraumatic; nontender (sinuses, temporal arteries, temporalis muscles).  PERRL, EOMI, conjunctiva mildly injected on the left.  No purulence. OP clear Neck: no lymphadenopathy, thyromegaly or carotid bruit Heart: regular rate and rhythm. No murmur, rub, gallop Lungs: clear bilaterally Abdomen: obese, soft, nontender. No organomegaly or mass.  No abdominal bruit Extremities: no edema, 2+ pulse Neuro: alert and oriented, cranial nerves intact. Normal strength, gait EKG--NSR, rate 61. LAE; some ST-T wave abnl in I and II. No  comparison available  BP Readings from Last 3 Encounters:  03/10/14 174/100  02/10/13 128/80  06/01/12 170/100   ASSESSMENT/PLAN:  Essential hypertension, malignant - BP very high; slightly lower on recheck (some white coat component). h/o elevated Cr, proteinuria and high BP's in past. Needs treatment. Currently asymptomatic - Plan: Comprehensive metabolic panel, Urinalysis Dipstick, amLODipine (NORVASC) 10 MG tablet  Pure hypercholesterolemia  Elevated serum creatinine  Proteinuria  Suspect proteinuria and elevated Cr is related to hypertension.  Discussed risks for renal failure related to HTN, importance of compliance and f/u.  Start amlodipine. Risks/side effects reviewed. May need to add diuretic if not at goal. Will need lipids rechecked (not fasting today). Low sodium diet, daily exercise, weight loss reviewed.  F/u in 10 days

## 2014-03-11 DIAGNOSIS — I10 Essential (primary) hypertension: Secondary | ICD-10-CM | POA: Insufficient documentation

## 2014-03-11 DIAGNOSIS — R7989 Other specified abnormal findings of blood chemistry: Secondary | ICD-10-CM | POA: Insufficient documentation

## 2014-03-11 DIAGNOSIS — E7800 Pure hypercholesterolemia, unspecified: Secondary | ICD-10-CM | POA: Insufficient documentation

## 2014-03-14 NOTE — Addendum Note (Signed)
Addended by: Debbrah AlarSMITH, Anthonella Klausner F on: 03/14/2014 10:58 AM   Modules accepted: Orders

## 2014-03-21 ENCOUNTER — Ambulatory Visit: Payer: BC Managed Care – PPO | Admitting: Medical

## 2014-03-25 ENCOUNTER — Telehealth: Payer: Self-pay | Admitting: Medical

## 2014-03-25 ENCOUNTER — Encounter: Payer: Self-pay | Admitting: Medical

## 2014-03-25 ENCOUNTER — Ambulatory Visit (INDEPENDENT_AMBULATORY_CARE_PROVIDER_SITE_OTHER): Payer: BC Managed Care – PPO | Admitting: Medical

## 2014-03-25 VITALS — BP 172/100 | HR 76 | Temp 98.0°F | Resp 16 | Wt 295.0 lb

## 2014-03-25 DIAGNOSIS — R7989 Other specified abnormal findings of blood chemistry: Secondary | ICD-10-CM

## 2014-03-25 DIAGNOSIS — E669 Obesity, unspecified: Secondary | ICD-10-CM

## 2014-03-25 DIAGNOSIS — H1013 Acute atopic conjunctivitis, bilateral: Secondary | ICD-10-CM

## 2014-03-25 DIAGNOSIS — M542 Cervicalgia: Secondary | ICD-10-CM

## 2014-03-25 DIAGNOSIS — R799 Abnormal finding of blood chemistry, unspecified: Secondary | ICD-10-CM

## 2014-03-25 DIAGNOSIS — G44201 Tension-type headache, unspecified, intractable: Secondary | ICD-10-CM

## 2014-03-25 DIAGNOSIS — I10 Essential (primary) hypertension: Secondary | ICD-10-CM

## 2014-03-25 LAB — LIPID PANEL
Cholesterol: 188 mg/dL (ref 0–200)
HDL: 44 mg/dL (ref >39)
LDL CALC: 126 mg/dL — AB (ref 0–99)
TRIGLYCERIDES: 91 mg/dL (ref <150)
Total CHOL/HDL Ratio: 4.3 Ratio
VLDL: 18 mg/dL (ref 0–40)

## 2014-03-25 MED ORDER — AMLODIPINE BESYLATE 10 MG PO TABS: 10.0000 mg | ORAL_TABLET | Freq: Every day | ORAL | Status: DC

## 2014-03-25 MED ORDER — CYCLOBENZAPRINE HCL 10 MG PO TABS: ORAL_TABLET | ORAL | Status: AC

## 2014-03-25 MED ORDER — HYDROCHLOROTHIAZIDE 25 MG PO TABS: 25.0000 mg | ORAL_TABLET | Freq: Every day | ORAL | Status: DC

## 2014-03-25 NOTE — Patient Instructions (Signed)
Hypertension Hypertension, commonly called high blood pressure, is when the force of blood pumping through your arteries is too strong. Your arteries are the blood vessels that carry blood from your heart throughout your body. A blood pressure reading consists of a higher number over a lower number, such as 110/72. The higher number (systolic) is the pressure inside your arteries when your heart pumps. The lower number (diastolic) is the pressure inside your arteries when your heart relaxes. Ideally you want your blood pressure below 120/80. Hypertension forces your heart to work harder to pump blood. Your arteries may become narrow or stiff. Having hypertension puts you at risk for heart disease, stroke, and other problems.  RISK FACTORS Some risk factors for high blood pressure are controllable. Others are not.  Risk factors you cannot control include:   Race. You may be at higher risk if you are African American.  Age. Risk increases with age.  Gender. Men are at higher risk than women before age 45 years. After age 65, women are at higher risk than men. Risk factors you can control include:  Not getting enough exercise or physical activity.  Being overweight.  Getting too much fat, sugar, calories, or salt in your diet.  Drinking too much alcohol. SIGNS AND SYMPTOMS Hypertension does not usually cause signs or symptoms. Extremely high blood pressure (hypertensive crisis) may cause headache, anxiety, shortness of breath, and nosebleed. DIAGNOSIS  To check if you have hypertension, your health care provider will measure your blood pressure while you are seated, with your arm held at the level of your heart. It should be measured at least twice using the same arm. Certain conditions can cause a difference in blood pressure between your right and left arms. A blood pressure reading that is higher than normal on one occasion does not mean that you need treatment. If one blood pressure reading  is high, ask your health care provider about having it checked again. TREATMENT  Treating high blood pressure includes making lifestyle changes and possibly taking medicine. Living a healthy lifestyle can help lower high blood pressure. You may need to change some of your habits. Lifestyle changes may include:  Following the DASH diet. This diet is high in fruits, vegetables, and whole grains. It is low in salt, red meat, and added sugars.  Getting at least 2 hours of brisk physical activity every week.  Losing weight if necessary.  Not smoking.  Limiting alcoholic beverages.  Learning ways to reduce stress. If lifestyle changes are not enough to get your blood pressure under control, your health care provider may prescribe medicine. You may need to take more than one. Work closely with your health care provider to understand the risks and benefits. HOME CARE INSTRUCTIONS  Have your blood pressure rechecked as directed by your health care provider.   Take medicines only as directed by your health care provider. Follow the directions carefully. Blood pressure medicines must be taken as prescribed. The medicine does not work as well when you skip doses. Skipping doses also puts you at risk for problems.   Do not smoke.   Monitor your blood pressure at home as directed by your health care provider. SEEK MEDICAL CARE IF:   You think you are having a reaction to medicines taken.  You have recurrent headaches or feel dizzy.  You have swelling in your ankles.  You have trouble with your vision. SEEK IMMEDIATE MEDICAL CARE IF:  You develop a severe headache or confusion.    You have unusual weakness, numbness, or feel faint.  You have severe chest or abdominal pain.  You vomit repeatedly.  You have trouble breathing. MAKE SURE YOU:   Understand these instructions.  Will watch your condition.  Will get help right away if you are not doing well or get worse. Document  Released: 04/29/2005 Document Revised: 09/13/2013 Document Reviewed: 02/19/2013 ExitCare Patient Information 2015 [employer]. This information is not intended to replace advice given to you by your health care provider. Make sure you discuss any questions you have with your health care provider. DASH Eating Plan DASH stands for "Dietary Approaches to Stop Hypertension." The DASH eating plan is a healthy eating plan that has been shown to reduce high blood pressure (hypertension). Additional health benefits may include reducing the risk of type 2 diabetes mellitus, heart disease, and stroke. The DASH eating plan may also help with weight loss. WHAT DO I NEED TO KNOW ABOUT THE DASH EATING PLAN? For the DASH eating plan, you will follow these general guidelines:  Choose foods with a percent daily value for sodium of less than 5% (as listed on the food label).  Use salt-free seasonings or herbs instead of table salt or sea salt.  Check with your health care provider or pharmacist before using salt substitutes.  Eat lower-sodium products, often labeled as "lower sodium" or "no salt added."  Eat fresh foods.  Eat more vegetables, fruits, and low-fat dairy products.  Choose whole grains. Look for the word "whole" as the first word in the ingredient list.  Choose fish and skinless chicken or turkey more often than red meat. Limit fish, poultry, and meat to 6 oz (170 g) each day.  Limit sweets, desserts, sugars, and sugary drinks.  Choose heart-healthy fats.  Limit cheese to 1 oz (28 g) per day.  Eat more home-cooked food and less restaurant, buffet, and fast food.  Limit fried foods.  Cook foods using methods other than frying.  Limit canned vegetables. If you do use them, rinse them well to decrease the sodium.  When eating at a restaurant, ask that your food be prepared with less salt, or no salt if possible. WHAT FOODS CAN I EAT? Seek help from a dietitian for individual  calorie needs. Grains Whole grain or whole wheat bread. Brown rice. Whole grain or whole wheat pasta. Quinoa, bulgur, and whole grain cereals. Low-sodium cereals. Corn or whole wheat flour tortillas. Whole grain cornbread. Whole grain crackers. Low-sodium crackers. Vegetables Fresh or frozen vegetables (raw, steamed, roasted, or grilled). Low-sodium or reduced-sodium tomato and vegetable juices. Low-sodium or reduced-sodium tomato sauce and paste. Low-sodium or reduced-sodium canned vegetables.  Fruits All fresh, canned (in natural juice), or frozen fruits. Meat and Other Protein Products Ground beef (85% or leaner), grass-fed beef, or beef trimmed of fat. Skinless chicken or turkey. Ground chicken or turkey. Pork trimmed of fat. All fish and seafood. Eggs. Dried beans, peas, or lentils. Unsalted nuts and seeds. Unsalted canned beans. Dairy Low-fat dairy products, such as skim or 1% milk, 2% or reduced-fat cheeses, low-fat ricotta or cottage cheese, or plain low-fat yogurt. Low-sodium or reduced-sodium cheeses. Fats and Oils Tub margarines without trans fats. Light or reduced-fat mayonnaise and salad dressings (reduced sodium). Avocado. Safflower, olive, or canola oils. Natural peanut or almond butter. Other Unsalted popcorn and pretzels. The items listed above may not be a complete list of recommended foods or beverages. Contact your dietitian for more options. WHAT FOODS ARE NOT RECOMMENDED? Grains White bread.   White pasta. White rice. Refined cornbread. Bagels and croissants. Crackers that contain trans fat. Vegetables Creamed or fried vegetables. Vegetables in a cheese sauce. Regular canned vegetables. Regular canned tomato sauce and paste. Regular tomato and vegetable juices. Fruits Dried fruits. Canned fruit in light or heavy syrup. Fruit juice. Meat and Other Protein Products Fatty cuts of meat. Ribs, chicken wings, bacon, sausage, bologna, salami, chitterlings, fatback, hot dogs,  bratwurst, and packaged luncheon meats. Salted nuts and seeds. Canned beans with salt. Dairy Whole or 2% milk, cream, half-and-half, and cream cheese. Whole-fat or sweetened yogurt. Full-fat cheeses or blue cheese. Nondairy creamers and whipped toppings. Processed cheese, cheese spreads, or cheese curds. Condiments Onion and garlic salt, seasoned salt, table salt, and sea salt. Canned and packaged gravies. Worcestershire sauce. Tartar sauce. Barbecue sauce. Teriyaki sauce. Soy sauce, including reduced sodium. Steak sauce. Fish sauce. Oyster sauce. Cocktail sauce. Horseradish. Ketchup and mustard. Meat flavorings and tenderizers. Bouillon cubes. Hot sauce. Tabasco sauce. Marinades. Taco seasonings. Relishes. Fats and Oils Butter, stick margarine, lard, shortening, ghee, and bacon fat. Coconut, palm kernel, or palm oils. Regular salad dressings. Other Pickles and olives. Salted popcorn and pretzels. The items listed above may not be a complete list of foods and beverages to avoid. Contact your dietitian for more information. WHERE CAN I FIND MORE INFORMATION? National Heart, Lung, and Blood Institute: www.nhlbi.nih.gov/health/health-topics/topics/dash/ Document Released: 04/18/2011 Document Revised: 09/13/2013 Document Reviewed: 03/03/2013 ExitCare Patient Information 2015 [employer]. This information is not intended to replace advice given to you by your health care provider. Make sure you discuss any questions you have with your health care provider.  

## 2014-03-25 NOTE — Telephone Encounter (Signed)
Please set him up for renal US and renal artery [place]

## 2014-03-25 NOTE — Progress Notes (Addendum)
Subjective: Here for recheck. Here with wife today. Saw Dr. Lynelle DoctorKnapp here 03/10/14 for elevated BPs.  Since that time is using Amlodipine 10mg  daily.  Last visit had labs, EKG.   I have seen him 3 times prior, 2 of which he had elevated BPs, but last visit bP was actually normal with me about a year ago.  We had previously discussed lifestyle changes, weight loss. He hasn't done so well with weight loss despite making some diet and exercise changes.  Has not been taking any decongestants; denies drug use; no stimulant OTC medications.  He denies sleep problems, daytime somnolence, apnea, not under a lot of stress.   Minimal ETOH use, nonsmoker.   He has been having headaches that he attributes to right sided neck pain, related to his bed. He wakes up with pain frequently (not daily, related to sleep position) and pain improves later in the morning without any treatment.    Review of Systems Constitutional: -fever, -chills, -sweats, -unexpected weight change,-fatigue ENT: -runny nose, -ear pain, -sore throat Cardiology:  -chest pain, -palpitations, -edema Respiratory: -cough, -shortness of breath, -wheezing Gastroenterology: -abdominal pain, -nausea, -vomiting, -diarrhea, -constipation  Hematology: -bleeding or bruising problems Ophthalmology: -vision changes Urology: -dysuria, -difficulty urinating, -hematuria, -urinary frequency, -urgency Neurology: +headache, -weakness, -tingling, -numbness   Past Medical History  Diagnosis Date  . Obesity   . Lactose intolerance   . Tension headache     resolved; prior with stressful job    Family History  Problem Relation Age of Onset  . Diabetes Mother   . Diabetes Maternal Grandmother   . Hyperlipidemia Maternal Grandmother   . Cancer Neg Hx   . Stroke Neg Hx   . Heart disease Neg Hx   . Hypertension Father     Objective: BP 172/100 mmHg  Pulse 76  Temp(Src) 98 F (36.7 C) (Oral)  Resp 16  Wt 295 lb (133.811 kg)  Gen: wd, wn, nad Eyes:  mild conjunctival injection, no crusting, no drainage, no pus, no stye, PERRLA, EOMi Neck: mild posterolateral, bilat neck tenderness, mildly decreased ROM due to pain, no lymphadenopathy, thyromegaly or carotid bruit Heart: regular rate and rhythm. No murmur, rub, gallop Lungs: clear bilaterally Abdomen: obese, soft, nontender. No organomegaly or mass.  No abdominal bruit Extremities: no edema, 2+ pulse Neuro: alert and oriented, cranial nerves intact. Normal strength, gait, nonfocal exam   BP Readings from Last 3 Encounters:  03/25/14 172/100  03/10/14 174/100  02/10/13 128/80    Assessment: Encounter Diagnoses  Name Primary?  . Essential hypertension Yes  . Abnormal serum creatinine level   . Obesity   . Neck pain   . Intractable tension-type headache, unspecified chronicity pattern   . Essential hypertension, malignant   . Allergic conjunctivitis, bilateral     Plan: Discussed findings, c/t Amlodipine 10mg  daily, add HCTZ 25 mg daily, low salt diet, healthy diet, exercise, work on weight loss efforts.  Will set up for renal artery and renal ultrasounds given the proteinuria and abnormal creatinine.   Lipid panel today.  Discussed complications of uncontrolled hypertension.  Discussed other secondary causes of hypertension.     Neck pain - advised stretching, heat, massage, can use Flexeril QHS prn  headache - related to neck pain.    Begin OTC allergy eye drop, if not improving in the next week, let me know.   Essential hypertension - Plan: Lipid panel, Microalbumin / creatinine urine ratio, US Renal Artery Stenosis, US Renal  Abnormal serum creatinine level -  Plan: Lipid panel, Microalbumin / creatinine urine ratio, US Renal Artery Stenosis, US Renal  Obesity - Plan: Lipid panel, Microalbumin / creatinine urine ratio, US Renal Artery Stenosis, US Renal  Neck pain  Intractable tension-type headache, unspecified chronicity pattern  Essential hypertension, malignant -  BP very high; slightly lower on recheck (some white coat component). h/o elevated Cr, proteinuria and high BP's in past. Needs treatment. Currently asymptomatic - Plan: amLODipine (NORVASC) 10 MG tablet  Allergic conjunctivitis, bilateral

## 2014-03-25 NOTE — Telephone Encounter (Signed)
Patient is aware of is appointment for his ultrasound on 04/01/14 @ 955am

## 2014-03-26 LAB — MICROALBUMIN / CREATININE URINE RATIO
Creatinine, Urine: 153.6 mg/dL
MICROALB UR: 10.7 mg/dL — AB (ref <2.0)
Microalb Creat Ratio: 69.7 mg/g — ABNORMAL HIGH (ref 0.0–30.0)

## 2014-04-01 ENCOUNTER — Other Ambulatory Visit: Payer: BLUE CROSS/BLUE SHIELD

## 2014-04-06 ENCOUNTER — Other Ambulatory Visit: Payer: Self-pay | Admitting: Family Medicine

## 2014-04-13 ENCOUNTER — Ambulatory Visit
Admission: RE | Admit: 2014-04-13 | Discharge: 2014-04-13 | Disposition: A | Discharge status: Home / Self Care | Payer: BC Managed Care – PPO | Source: Ambulatory Visit | Attending: Medical | Admitting: Medical

## 2014-04-13 ENCOUNTER — Ambulatory Visit
Admission: RE | Admit: 2014-04-13 | Discharge: 2014-04-13 | Disposition: A | Discharge status: Home / Self Care | Payer: BLUE CROSS/BLUE SHIELD | Source: Ambulatory Visit | Attending: Medical | Admitting: Medical

## 2014-04-13 DIAGNOSIS — R799 Abnormal finding of blood chemistry, unspecified: Secondary | ICD-10-CM

## 2014-04-13 DIAGNOSIS — R7989 Other specified abnormal findings of blood chemistry: Secondary | ICD-10-CM

## 2014-04-13 DIAGNOSIS — E669 Obesity, unspecified: Secondary | ICD-10-CM

## 2014-04-13 DIAGNOSIS — I10 Essential (primary) hypertension: Secondary | ICD-10-CM

## 2014-05-23 ENCOUNTER — Telehealth: Payer: Self-pay | Admitting: Internal Medicine

## 2014-05-23 ENCOUNTER — Other Ambulatory Visit: Payer: Self-pay | Admitting: Family Medicine

## 2014-05-23 DIAGNOSIS — I10 Essential (primary) hypertension: Secondary | ICD-10-CM

## 2014-05-23 MED ORDER — AMLODIPINE BESYLATE 10 MG PO TABS: 10.0000 mg | ORAL_TABLET | Freq: Every day | ORAL | Status: DC

## 2014-05-23 MED ORDER — HYDROCHLOROTHIAZIDE 25 MG PO TABS: 25.0000 mg | ORAL_TABLET | Freq: Every day | ORAL | Status: DC

## 2014-05-23 NOTE — Telephone Encounter (Signed)
Refill request for hctz # 90 to [facility] [address]

## 2014-05-23 NOTE — Telephone Encounter (Signed)
Also needs a refill on amlodipine 10mg  #90

## 2014-05-23 NOTE — Telephone Encounter (Signed)
Medication refills was sent to his pharmacy for a 90 day supply 

## 2014-06-21 ENCOUNTER — Other Ambulatory Visit: Payer: Self-pay | Admitting: Medical

## 2014-06-25 ENCOUNTER — Other Ambulatory Visit: Payer: Self-pay | Admitting: Medical

## 2014-06-27 NOTE — Telephone Encounter (Signed)
Is this okay to refill? 

## 2014-08-16 ENCOUNTER — Other Ambulatory Visit: Payer: Self-pay | Admitting: Medical

## 2014-12-03 ENCOUNTER — Other Ambulatory Visit: Payer: Self-pay | Admitting: Medical

## 2015-02-28 ENCOUNTER — Telehealth: Payer: Self-pay

## 2015-02-28 ENCOUNTER — Institutional Professional Consult (permissible substitution): Payer: Self-pay | Admitting: Medical

## 2015-02-28 NOTE — Telephone Encounter (Signed)
Courtney handles calls.  I will mail letter.

## 2015-02-28 NOTE — Telephone Encounter (Signed)
Scheduled for 10/24 to come in and discuss his medications.

## 2015-02-28 NOTE — Telephone Encounter (Signed)
D

## 2015-02-28 NOTE — Telephone Encounter (Signed)
This patient no showed for their appointment today.Which of the following is necessary for this patient.   A) No follow-up necessary   B) Follow-up urgent. Locate Patient Immediately.   C) Follow-up necessary. Contact patient and Schedule visit in ____ Days.   D) Follow-up Advised. Contact patient and Schedule visit in ____ Days. 

## 2015-03-03 ENCOUNTER — Institutional Professional Consult (permissible substitution): Payer: Self-pay | Admitting: Medical

## 2015-03-06 ENCOUNTER — Encounter: Payer: Self-pay | Admitting: Medical

## 2015-03-06 ENCOUNTER — Ambulatory Visit (INDEPENDENT_AMBULATORY_CARE_PROVIDER_SITE_OTHER): Payer: BLUE CROSS/BLUE SHIELD | Admitting: Medical

## 2015-03-06 VITALS — BP 164/108 | HR 65 | Temp 98.1°F | Wt 318.0 lb

## 2015-03-06 DIAGNOSIS — I10 Essential (primary) hypertension: Secondary | ICD-10-CM

## 2015-03-06 DIAGNOSIS — R7989 Other specified abnormal findings of blood chemistry: Secondary | ICD-10-CM

## 2015-03-06 DIAGNOSIS — E7800 Pure hypercholesterolemia, unspecified: Secondary | ICD-10-CM | POA: Diagnosis not present

## 2015-03-06 DIAGNOSIS — E669 Obesity, unspecified: Secondary | ICD-10-CM

## 2015-03-06 DIAGNOSIS — R748 Abnormal levels of other serum enzymes: Secondary | ICD-10-CM | POA: Diagnosis not present

## 2015-03-06 MED ORDER — AZILSARTAN-CHLORTHALIDONE 40-12.5 MG PO TABS: 1.0000 | ORAL_TABLET | Freq: Every day | ORAL | Status: DC

## 2015-03-06 NOTE — Progress Notes (Signed)
Subjective: Chief Complaint  Patient presents with  . discuss meds    trying to have a baby so he wants to switch to something else   Here for med check.  Last visit here 03/2014.   He and wife are trying to conceive.  Fertility specialist wants him off calcium channel blocker as it reduces sperm's ability to penetrate the egg.   Has hx/o hypertension, proteinuria, obesity, hyperlipidemia.   He has been taking Amlodipine, but its not controlling the BPs.   Exercising - walking everywhere.  Diet is yo yo, sometimes doing fine, then will revert back to comfort food.   Not taking any NSAIDs on a regular basis.   Doesn't add salt to food.  Of note we had him go for renal US and renal artery ultrasound 04/2014 and those were normal.  Denies any chest pain, edema, sob.   Doing fine in general.  He does endorse eating chips, ice cream, candy somewhat regularly.  No other aggravating or relieving factors.  No other complaint.    Past Medical History  Diagnosis Date  . Obesity   . Lactose intolerance   . Tension headache     resolved; prior with stressful job  . Hypertension   . Hyperlipidemia   . Proteinuria    ROS as in subjective   Objective: BP 164/108 mmHg  Pulse 65  Temp(Src) 98.1 F (36.7 C) (Oral)  Wt 318 lb (144.244 kg)  SpO2 96%   BP Readings from Last 3 Encounters:  03/06/15 164/108  03/25/14 172/100  03/10/14 174/100   Wt Readings from Last 3 Encounters:  03/06/15 318 lb (144.244 kg)  03/25/14 295 lb (133.811 kg)  03/10/14 302 lb (136.986 kg)    General appearance: alert, no distress, WD/WN, obese AA male Oral cavity: MMM, no lesions Neck: supple, no lymphadenopathy, no thyromegaly, no masses, no bruits Heart: RRR, normal S1, S2, no murmurs  Lungs: CTA bilaterally, no wheezes, rhonchi, or rales Abdomen: +bs, soft, non tender, non distended, no masses, no hepatomegaly, no splenomegaly Pulses: 2+ symmetric, upper and lower extremities, normal cap refill Ext: no  edema    Assessment: Encounter Diagnoses  Name Primary?  . Essential hypertension, malignant Yes  . Pure hypercholesterolemia   . Elevated serum creatinine   . Obesity     Plan: Discussed his weight gain, need for much better control of diet, exercise.    Consider sleep study.   Stop Norvasc and HCTZ.   Begin trial of Edarbychlor.  Discussed risks/benefits of medication.   return in 63mo fasting for recheck and labs.

## 2015-03-07 ENCOUNTER — Telehealth: Payer: Self-pay

## 2015-03-07 NOTE — Telephone Encounter (Signed)
Pt called saying he was told to call back and let you know if the Jon Hansen is too expensive

## 2015-03-08 NOTE — Telephone Encounter (Signed)
done

## 2015-03-08 NOTE — Telephone Encounter (Signed)
Pt states coupon card is only for 1 free month.  He states with insurance it's $45 a month and he doesn't want to spend that much.  In meantime, He has samples and will try them.  He scheduled CPE for next month.   [facility], first month free then $10 a month thereafter.  Gave Rx & ins info to pharmacist Olegario MessierKathy t# [phone] & she will work on it and call me back.

## 2015-03-08 NOTE — Telephone Encounter (Signed)
[facility] this month then $10 month thereafter.  Pt informed

## 2015-03-08 NOTE — Telephone Encounter (Signed)
Make sure he activated coupon card or presented it to the pharmacy.  If he did , what was the copay?  [facility]   I think we can get cleared on prior Auth if that is what will happen as he has already failed 2 other meds.  Let me know

## 2015-04-03 ENCOUNTER — Encounter: Payer: Self-pay | Admitting: Medical

## 2015-04-03 ENCOUNTER — Ambulatory Visit (INDEPENDENT_AMBULATORY_CARE_PROVIDER_SITE_OTHER): Payer: BLUE CROSS/BLUE SHIELD | Admitting: Medical

## 2015-04-03 VITALS — BP 160/100 | HR 62 | Ht 72.25 in | Wt 314.0 lb

## 2015-04-03 DIAGNOSIS — Z0000 Encounter for general adult medical examination without abnormal findings: Secondary | ICD-10-CM | POA: Diagnosis not present

## 2015-04-03 DIAGNOSIS — E669 Obesity, unspecified: Secondary | ICD-10-CM | POA: Diagnosis not present

## 2015-04-03 DIAGNOSIS — Z23 Encounter for immunization: Secondary | ICD-10-CM | POA: Diagnosis not present

## 2015-04-03 DIAGNOSIS — R809 Proteinuria, unspecified: Secondary | ICD-10-CM | POA: Diagnosis not present

## 2015-04-03 DIAGNOSIS — R748 Abnormal levels of other serum enzymes: Secondary | ICD-10-CM | POA: Diagnosis not present

## 2015-04-03 DIAGNOSIS — E7800 Pure hypercholesterolemia, unspecified: Secondary | ICD-10-CM

## 2015-04-03 DIAGNOSIS — R7989 Other specified abnormal findings of blood chemistry: Secondary | ICD-10-CM

## 2015-04-03 DIAGNOSIS — I10 Essential (primary) hypertension: Secondary | ICD-10-CM | POA: Insufficient documentation

## 2015-04-03 LAB — POCT URINALYSIS DIPSTICK
Bilirubin, UA: NEGATIVE
Glucose, UA: NEGATIVE
Ketones, UA: NEGATIVE
Leukocytes, UA: NEGATIVE
Nitrite, UA: NEGATIVE
RBC UA: NEGATIVE
Spec Grav, UA: 1.025
UROBILINOGEN UA: NEGATIVE
pH, UA: 6

## 2015-04-03 LAB — COMPREHENSIVE METABOLIC PANEL
ALK PHOS: 60 U/L (ref 40–115)
ALT: 17 U/L (ref 9–46)
AST: 17 U/L (ref 10–40)
Albumin: 3.9 g/dL (ref 3.6–5.1)
BUN: 13 mg/dL (ref 7–25)
CHLORIDE: 101 mmol/L (ref 98–110)
CO2: 29 mmol/L (ref 20–31)
Calcium: 9.3 mg/dL (ref 8.6–10.3)
Creat: 1.33 mg/dL (ref 0.60–1.35)
Glucose, Bld: 86 mg/dL (ref 65–99)
Potassium: 3.5 mmol/L (ref 3.5–5.3)
SODIUM: 140 mmol/L (ref 135–146)
TOTAL PROTEIN: 7.4 g/dL (ref 6.1–8.1)
Total Bilirubin: 0.7 mg/dL (ref 0.2–1.2)

## 2015-04-03 LAB — LIPID PANEL
CHOL/HDL RATIO: 4.9 ratio (ref <=5.0)
CHOLESTEROL: 187 mg/dL (ref 125–200)
HDL: 38 mg/dL — ABNORMAL LOW (ref >=40)
LDL Cholesterol: 131 mg/dL — ABNORMAL HIGH (ref <130)
Triglycerides: 92 mg/dL (ref <150)
VLDL: 18 mg/dL (ref <30)

## 2015-04-03 LAB — HEMOGLOBIN A1C
Hgb A1c MFr Bld: 5.8 % — ABNORMAL HIGH (ref <5.7)
Mean Plasma Glucose: 120 mg/dL — ABNORMAL HIGH (ref <117)

## 2015-04-03 LAB — TSH: TSH: 0.797 u[IU]/mL (ref 0.350–4.500)

## 2015-04-03 LAB — CBC
HEMATOCRIT: 40 % (ref 39.0–52.0)
Hemoglobin: 13.3 g/dL (ref 13.0–17.0)
MCH: 28.2 pg (ref 26.0–34.0)
MCHC: 33.3 g/dL (ref 30.0–36.0)
MCV: 84.7 fL (ref 78.0–100.0)
MPV: 11.4 fL (ref 8.6–12.4)
Platelets: 250 10*3/uL (ref 150–400)
RBC: 4.72 MIL/uL (ref 4.22–5.81)
RDW: 14.9 % (ref 11.5–15.5)
WBC: 7.5 10*3/uL (ref 4.0–10.5)

## 2015-04-03 NOTE — Addendum Note (Signed)
Addended by: Kieth BrightlyLAWSON, Alexes Lamarque M on: 04/03/2015 04:07 PM   Modules accepted: Kipp BroodSmartSet

## 2015-04-03 NOTE — Progress Notes (Signed)
Subjective:   HPI  Jon Hansen is a 39 y.o. male who presents for a complete physical.  BP still not controlled despite changing to [employer] last visit.  We stopped Norvasc and HCTZ, as fertility specialist advised against CCBs.  He checks BPs some.  Reviewed their medical, surgical, family, social, medication, and allergy history and updated chart as appropriate.  Past Medical History  Diagnosis Date  . Obesity   . Lactose intolerance   . Tension headache     resolved; prior with stressful job  . Hypertension   . Hyperlipidemia   . Proteinuria     History reviewed. No pertinent past surgical history.  Social History   Social History  . Marital Status: Married    Spouse Name: N/A  . Number of Children: N/A  . Years of Education: N/A   Occupational History  . works in Multimedia programmer    Social History Main Topics  . Smoking status: Never Smoker   . Smokeless tobacco: Never Used  . Alcohol Use: 0.6 oz/week    1 Shots of liquor per week     Comment: occasionally  . Drug Use: No  . Sexual Activity: Not on file   Other Topics Concern  . Not on file   Social History Narrative   Works in a medical lab. (Former--High school Administrator, arts), limited exercise - walking, parking further from entrances;  Married - no biological children.  Christian    Family History  Problem Relation Age of Onset  . Diabetes Mother   . Diabetes Maternal Grandmother   . Hyperlipidemia Maternal Grandmother   . Cancer Neg Hx   . Stroke Neg Hx   . Heart disease Neg Hx   . Hypertension Father      Current outpatient prescriptions:  .  Azilsartan-Chlorthalidone (EDARBYCLOR) 40-12.5 MG TABS, Take 1 tablet by mouth daily., Disp: 30 tablet, Rfl: 2 .  cyclobenzaprine (FLEXERIL) 10 MG tablet, 1 tablet po QHS prn (Patient not taking: Reported on 03/06/2015), Disp: 20 tablet, Rfl: 0 .  ibuprofen (ADVIL,MOTRIN) 200 MG tablet, Take 200-600 mg by mouth every 6 (six) hours as needed. pain,  Disp: , Rfl:   No Known Allergies  Review of Systems Constitutional: -fever, -chills, -sweats, -unexpected weight change, -decreased appetite, -fatigue Allergy: -sneezing, -itching, -congestion Dermatology: -changing moles, --rash, -lumps ENT: -runny nose, -ear pain, -sore throat, -hoarseness, -sinus pain, -teeth pain, - ringing in ears, -hearing loss, -nosebleeds Cardiology: -chest pain, -palpitations, -swelling, -difficulty breathing when lying flat, -waking up short of breath Respiratory: -cough, -shortness of breath, -difficulty breathing with exercise or exertion, -wheezing, -coughing up blood Gastroenterology: -abdominal pain, -nausea, -vomiting, -diarrhea, -constipation, -blood in stool, -changes in bowel movement, -difficulty swallowing or eating Hematology: -bleeding, -bruising  Musculoskeletal: -joint aches, -muscle aches, -joint swelling, -back pain, -neck pain, -cramping, -changes in gait Ophthalmology: denies vision changes, eye redness, itching, discharge Urology: -burning with urination, -difficulty urinating, -blood in urine, -urinary frequency, -urgency, -incontinence Neurology: -headache, -weakness, -tingling, -numbness, -memory loss, -falls, -dizziness Psychology: -depressed mood, -agitation, -sleep problems     Objective:   Physical Exam  BP 160/100 mmHg  Pulse 62  Ht 6' 0.25" (1.835 m)  Wt 314 lb (142.429 kg)  BMI 42.30 kg/m2  General appearance: alert, no distress, WD/WN, AA male Skin: right neck anteriorly within beard with 1cm raised slightly tender ingrown hair follicle, no worrisome lesions HEENT: normocephalic, conjunctiva/corneas normal, sclerae anicteric, PERRLA, EOMi, nares patent, no discharge or erythema, pharynx normal Oral cavity:  MMM, tongue normal, teeth normal Neck: supple, no lymphadenopathy, no thyromegaly, no masses, normal ROM, no bruits Chest: non tender, normal shape and expansion Heart: RRR, normal S1, S2, no murmurs Lungs: CTA  bilaterally, no wheezes, rhonchi, or rales Abdomen: +bs, soft, non tender, non distended, no masses, no hepatomegaly, no splenomegaly, no bruits Back: non tender, normal ROM, no scoliosis Musculoskeletal: upper extremities non tender, no obvious deformity, normal ROM throughout, lower extremities non tender, no obvious deformity, normal ROM throughout Extremities: no edema, no cyanosis, no clubbing Pulses: 2+ symmetric, upper and lower extremities, normal cap refill Neurological: alert, oriented x 3, CN2-12 intact, strength normal upper extremities and lower extremities, sensation normal throughout, DTRs 2+ throughout, no cerebellar signs, gait normal Psychiatric: normal affect, behavior normal, pleasant  GU: normal male external genitalia, circumcised, nontender, no masses, no hernia, no lymphadenopathy Rectal: deferred due to age 39yo and no indication today   Adult ECG Report  Indication: physical, uncontrolled hypertension  Rate: 51 bpm  Rhythm: sinus bradycardia and sinus arrhythmia  QRS Axis: 61 degrees  PR Interval: 158ms  QRS Duration: 102ms  QTc: 433ms  Conduction Disturbances: none  Other Abnormalities: none  Patient's cardiac risk factors are: dyslipidemia, hypertension, male gender, obesity (BMI >= 30 kg/m2) and sedentary lifestyle.  EKG comparison: compared to 02/2014 EKG, no change  Narrative Interpretation: sinus bradycardia, no acute changes     Assessment and Plan :    Encounter Diagnoses  Name Primary?  . Routine general medical examination at a health care facility Yes  . Need for influenza vaccination   . Elevated serum creatinine   . Pure hypercholesterolemia   . Obesity   . Essential hypertension   . Proteinuria     Physical exam - discussed healthy lifestyle, diet, exercise, preventative care, vaccinations, and addressed their concerns.   See your dentist yearly for routine dental care including hygiene visits twice yearly. See your eye doctor yearly  for routine vision care. Routine labs today.  Vaccinations: Counseled on the influenza virus vaccine.  Vaccine information sheet given.  Influenza vaccine given after consent obtained.  Other concerns today: uncontrolled hypertension - despite changing to Edarbychlor, will need to consider other modification pending labs.  Discussed diet, salt avoidance, routine exercise.  F/u pending labs  Follow up pending labs

## 2015-04-04 LAB — HIGH SENSITIVITY CRP: CRP, High Sensitivity: 6 mg/L — ABNORMAL HIGH

## 2015-04-04 LAB — MICROALBUMIN / CREATININE URINE RATIO
Creatinine, Urine: 291 mg/dL (ref 20–370)
MICROALB/CREAT RATIO: 25 ug/mg{creat} (ref <30)
Microalb, Ur: 7.3 mg/dL

## 2015-04-04 LAB — ANA: ANA: NEGATIVE

## 2015-04-10 ENCOUNTER — Other Ambulatory Visit: Payer: Self-pay | Admitting: Medical

## 2015-04-10 MED ORDER — AZILSARTAN-CHLORTHALIDONE 40-12.5 MG PO TABS
1.0000 | ORAL_TABLET | Freq: Every day | ORAL | Status: DC
Start: 2015-04-10 — End: 2015-07-12

## 2015-04-10 MED ORDER — PRAVASTATIN SODIUM 20 MG PO TABS: 20.0000 mg | ORAL_TABLET | Freq: Every day | ORAL | Status: DC

## 2015-04-10 MED ORDER — ATENOLOL 25 MG PO TABS: 25.0000 mg | ORAL_TABLET | Freq: Every day | ORAL | Status: DC

## 2015-07-12 ENCOUNTER — Other Ambulatory Visit: Payer: Self-pay | Admitting: Medical

## 2015-07-19 ENCOUNTER — Other Ambulatory Visit: Payer: Self-pay | Admitting: Medical

## 2015-07-26 ENCOUNTER — Ambulatory Visit: Payer: BLUE CROSS/BLUE SHIELD | Admitting: Medical

## 2015-07-26 ENCOUNTER — Telehealth: Payer: Self-pay | Admitting: Medical

## 2015-07-26 ENCOUNTER — Encounter: Payer: Self-pay | Admitting: Medical

## 2015-07-26 ENCOUNTER — Ambulatory Visit (INDEPENDENT_AMBULATORY_CARE_PROVIDER_SITE_OTHER): Payer: BLUE CROSS/BLUE SHIELD | Admitting: Medical

## 2015-07-26 VITALS — BP 170/118 | HR 66 | Wt 325.0 lb

## 2015-07-26 DIAGNOSIS — R809 Proteinuria, unspecified: Secondary | ICD-10-CM

## 2015-07-26 DIAGNOSIS — E7800 Pure hypercholesterolemia, unspecified: Secondary | ICD-10-CM

## 2015-07-26 DIAGNOSIS — R748 Abnormal levels of other serum enzymes: Secondary | ICD-10-CM | POA: Diagnosis not present

## 2015-07-26 DIAGNOSIS — E669 Obesity, unspecified: Secondary | ICD-10-CM

## 2015-07-26 DIAGNOSIS — R7989 Other specified abnormal findings of blood chemistry: Secondary | ICD-10-CM

## 2015-07-26 DIAGNOSIS — I10 Essential (primary) hypertension: Secondary | ICD-10-CM | POA: Diagnosis not present

## 2015-07-26 MED ORDER — AZILSARTAN-CHLORTHALIDONE 40-25 MG PO TABS: 1.0000 | ORAL_TABLET | Freq: Every day | ORAL | Status: DC

## 2015-07-26 MED ORDER — ASPIRIN EC 81 MG PO TBEC: 81.0000 mg | DELAYED_RELEASE_TABLET | Freq: Every day | ORAL | Status: AC

## 2015-07-26 MED ORDER — ATENOLOL 50 MG PO TABS: 50.0000 mg | ORAL_TABLET | Freq: Every day | ORAL | Status: DC

## 2015-07-26 MED ORDER — PRAVASTATIN SODIUM 20 MG PO TABS: ORAL_TABLET | ORAL | Status: AC

## 2015-07-26 NOTE — Telephone Encounter (Signed)
I thought I recalled [facility]  Was this the case?  If so, please send his meds there instead and cancel the ones to the local pharmacy.

## 2015-07-26 NOTE — Progress Notes (Signed)
Subjective: Chief Complaint  Patient presents with  . Follow-up    has not lost weight and has not checked bp   Here with wife today for recheck.  He has hx/o obesity, low HDL, hypertension, proteinuria, impaired glucose, elevated CRP.  I saw him in November for a physical.  At that time we added Atenolol  for BP, added Pravachol for cholesterol and he is compliant with those and Edarbycholor.  Not checking BPs.  Since last visit he took on a second job due to financial concerns, not having time to exercise, has actually gained some weight.   Wife says he does snore, he endorses non restful sleep, sometimes daytime somnolence, fatigue.  Wife is pregnant.  No other aggravating or relieving factors. No other complaint.  Past Medical History  Diagnosis Date  . Obesity   . Lactose intolerance   . Tension headache     resolved; prior with stressful job  . Hypertension   . Hyperlipidemia   . Proteinuria    ROS as in subjective    Objective: BP 170/118 mmHg  Pulse 66  Wt 325 lb (147.419 kg)  General appearance: alert, no distress, WD/WN,  Neck: supple, no lymphadenopathy, no thyromegaly, no masses, no bruits Heart: RRR, normal S1, S2, no murmurs Lungs: CTA bilaterally, no wheezes, rhonchi, or rales Ext: no edema Pulses: 2+ symmetric, upper and lower extremities, normal cap refill   Assessment: Encounter Diagnoses  Name Primary?  . Essential hypertension Yes  . Pure hypercholesterolemia   . Elevated serum creatinine   . Obesity   . Proteinuria     Plan: He is non fasting today.   He will return for additional labs to recheck success of Pravachol.  I increased his Edarbycholor dose and Atenolol dose today.  C/t Pravachol, add Aspirin QHS.  Recommended sleep study, but after he called insurer today, he learned that sleep study would cost him $1500 his deductible and he can't afford this currently.   Thus, he declines sleep study. Advised he work harder at [employer], getting  some exercise.  F/u 51mo on BP, soon fasting for labs.   Sequan was seen today for follow-up.  Diagnoses and all orders for this visit:  Essential hypertension -     Comprehensive metabolic panel -     Cancel: Lipid panel -     Lipid panel; Future -     Comprehensive metabolic panel; Future  Pure hypercholesterolemia -     Comprehensive metabolic panel -     Cancel: Lipid panel -     Lipid panel; Future -     Comprehensive metabolic panel; Future  Elevated serum creatinine -     Comprehensive metabolic panel -     Cancel: Lipid panel -     Lipid panel; Future -     Comprehensive metabolic panel; Future  Obesity -     Comprehensive metabolic panel -     Cancel: Lipid panel -     Lipid panel; Future -     Comprehensive metabolic panel; Future  Proteinuria -     Comprehensive metabolic panel -     Cancel: Lipid panel -     Lipid panel; Future -     Comprehensive metabolic panel; Future  Other orders -     atenolol (TENORMIN) 50 MG tablet; Take 1 tablet (50 mg total) by mouth daily. -     pravastatin (PRAVACHOL) 20 MG tablet; TAKE 1 TABLET (20 MG TOTAL) BY MOUTH  DAILY. -     Azilsartan-Chlorthalidone 40-25 MG TABS; Take 1 tablet by mouth daily.

## 2015-07-27 NOTE — Telephone Encounter (Signed)
LMTCB

## 2015-07-27 NOTE — Telephone Encounter (Signed)
[facility] [address] so everything is fine

## 2015-08-23 ENCOUNTER — Telehealth: Payer: Self-pay

## 2015-08-23 MED ORDER — ATENOLOL 50 MG PO TABS: 50.0000 mg | ORAL_TABLET | Freq: Every day | ORAL | Status: AC

## 2015-08-23 NOTE — Telephone Encounter (Signed)
Pt called and did a price check and wanted bp medications send to [place]. Sent over.

## 2015-08-25 ENCOUNTER — Other Ambulatory Visit: Payer: Self-pay | Admitting: Medical

## 2015-08-30 ENCOUNTER — Telehealth: Payer: Self-pay | Admitting: Medical

## 2015-08-30 ENCOUNTER — Other Ambulatory Visit: Payer: Self-pay | Admitting: Medical

## 2015-08-30 MED ORDER — AZILSARTAN-CHLORTHALIDONE 40-25 MG PO TABS: 1.0000 | ORAL_TABLET | Freq: Every day | ORAL | Status: AC

## 2015-08-30 NOTE — Telephone Encounter (Signed)
Jon Hansen - Double check doses before doing refills.   I refilled Jon Hansen 40/25 on [facility].  [facility] [employer]/12.5 yesterday and this was sent instead in error.    Just an FYI.  We called out the correct 40/25 dose.

## 2015-10-16 ENCOUNTER — Other Ambulatory Visit: Payer: Self-pay | Admitting: Medical

## 2015-11-08 IMAGING — US US RENAL ARTERY STENOSIS
1 series · 14 of 25 positions shown · non-contrast
Comparison: None.

CLINICAL DATA: Hypertension, elevated creatinine, proteinuria

EXAM:
RENAL DUPLEX ULTRASOUND

[Series 1: us renal artery stenosis · 0.33mm/px · 14 of 38 slices shown]
[im 1/38]
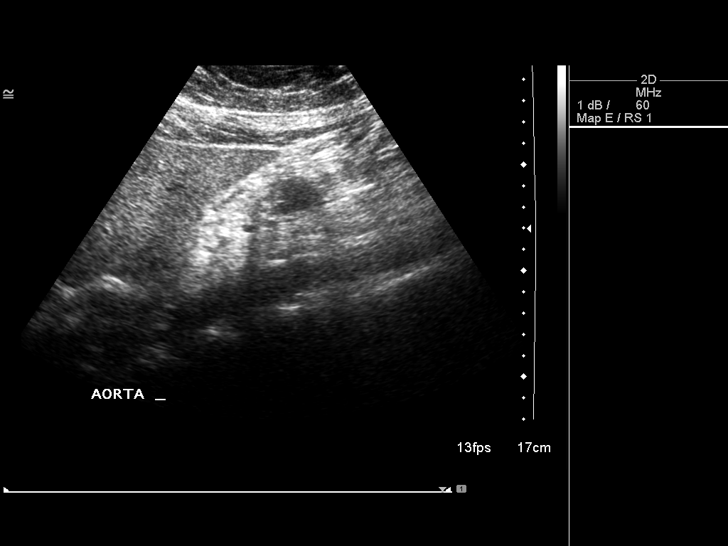
[im 4/38]
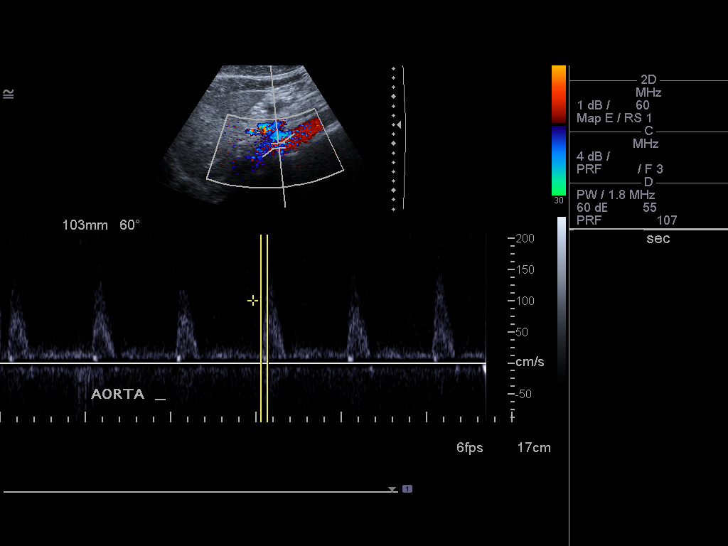
[im 7/38]
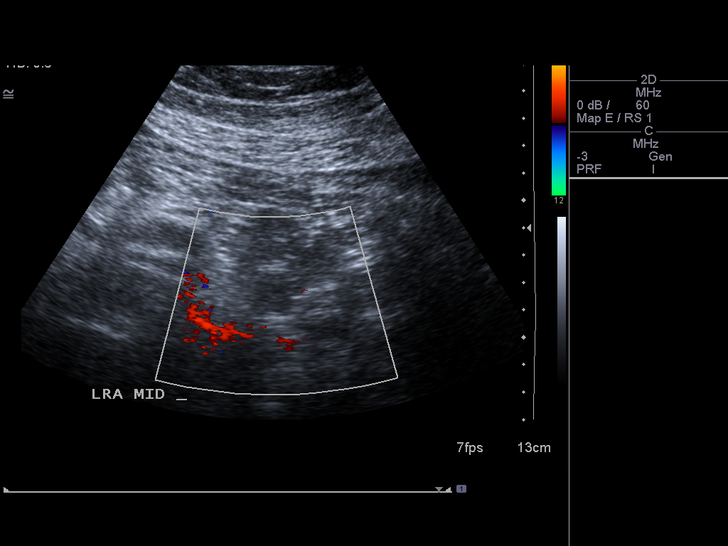
[im 10/38]
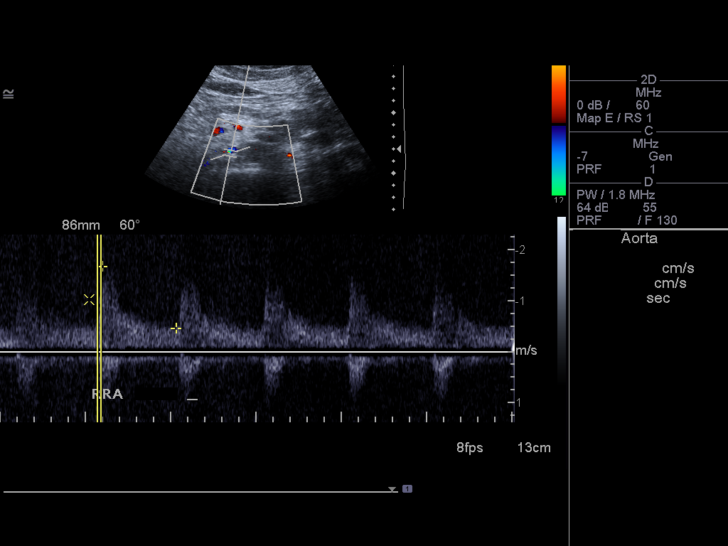
[im 13/38]
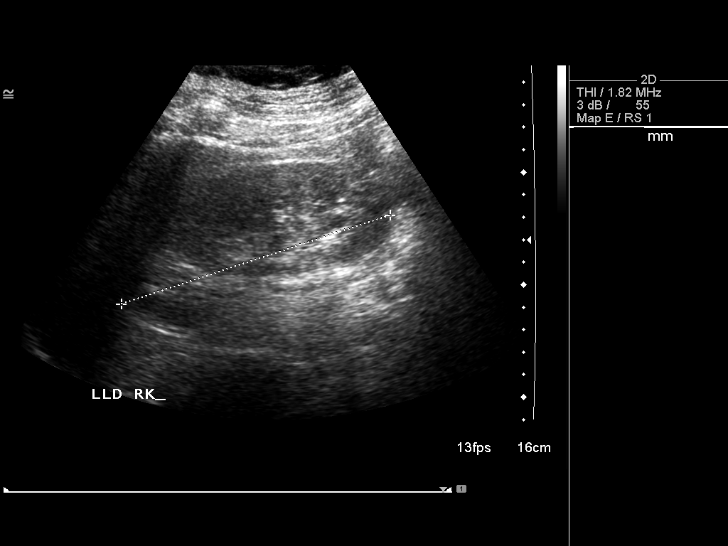
[im 14/38]
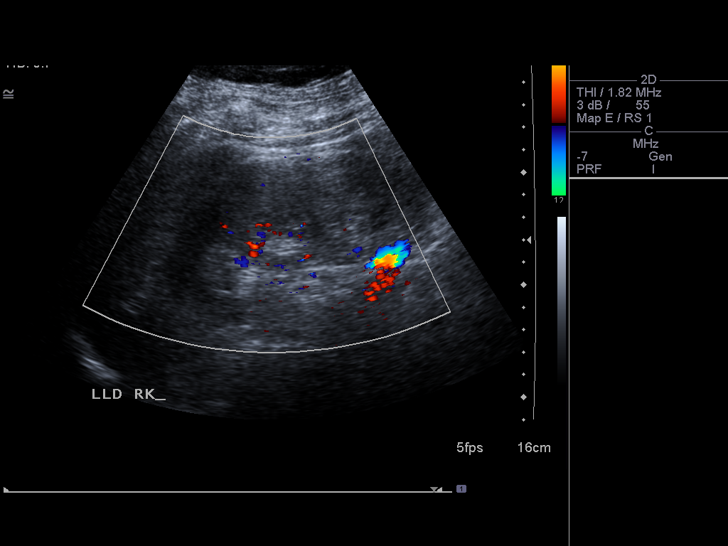
[im 17/38]
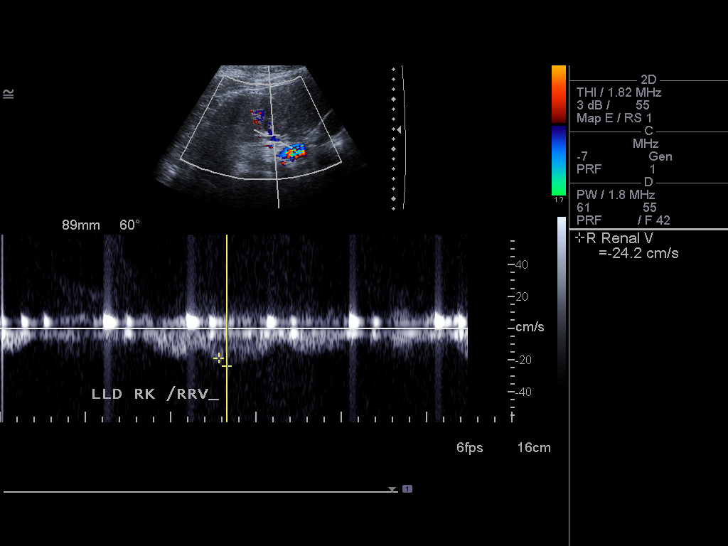
[im 21/38]
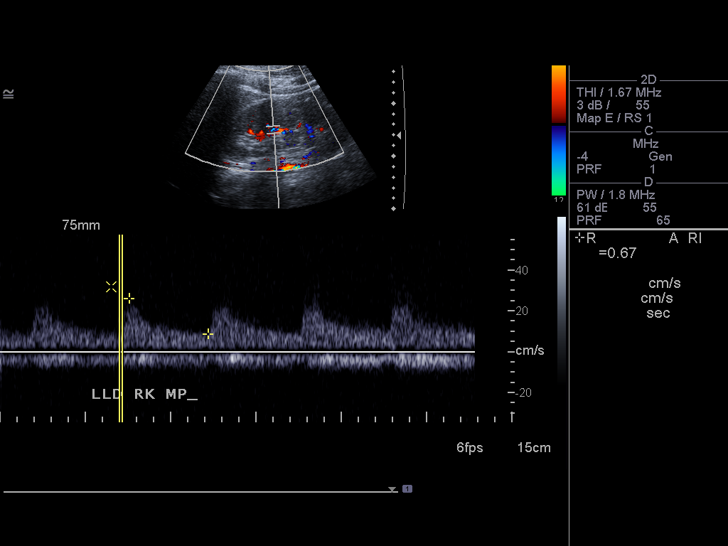
[im 24/38]
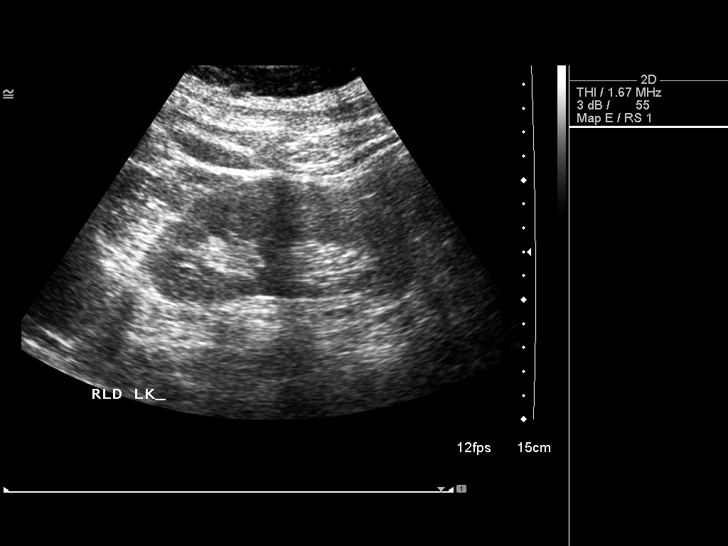
[im 25/38]
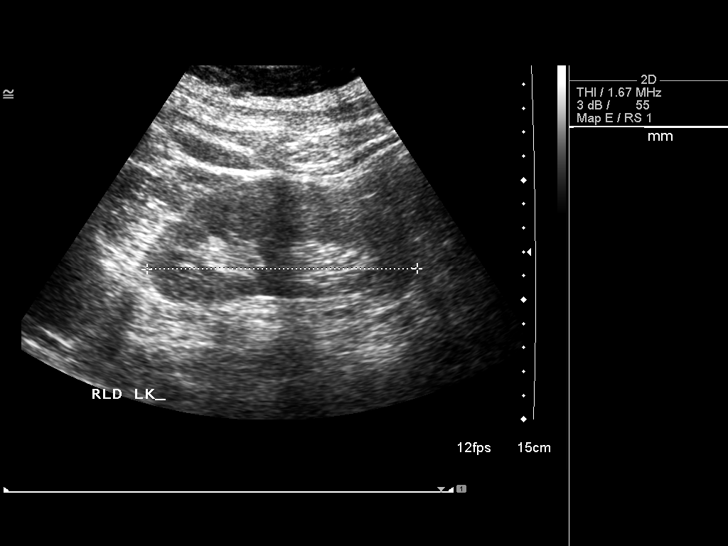
[im 28/38]
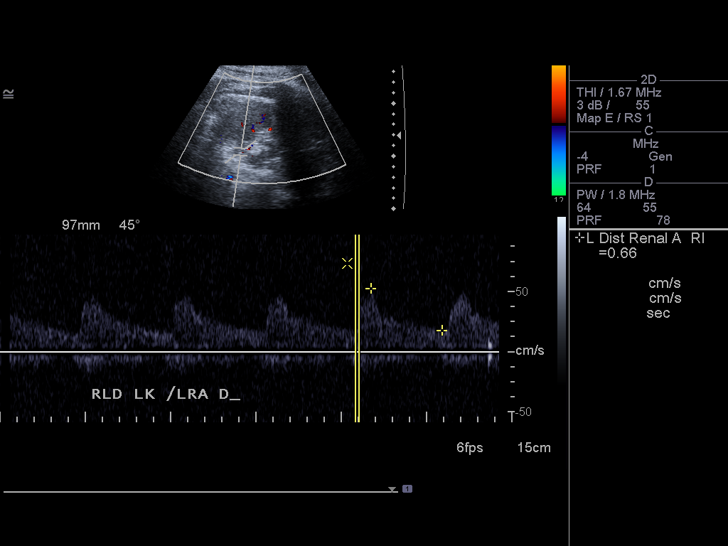
[im 31/38]
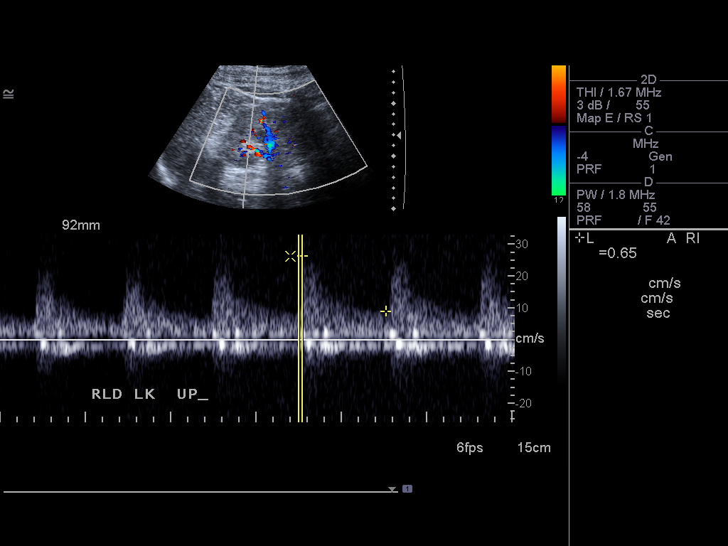
[im 34/38]
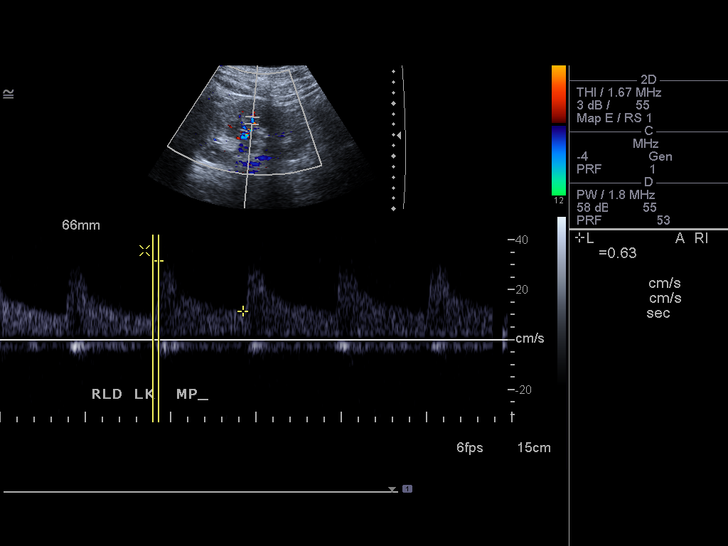
[im 38/38]
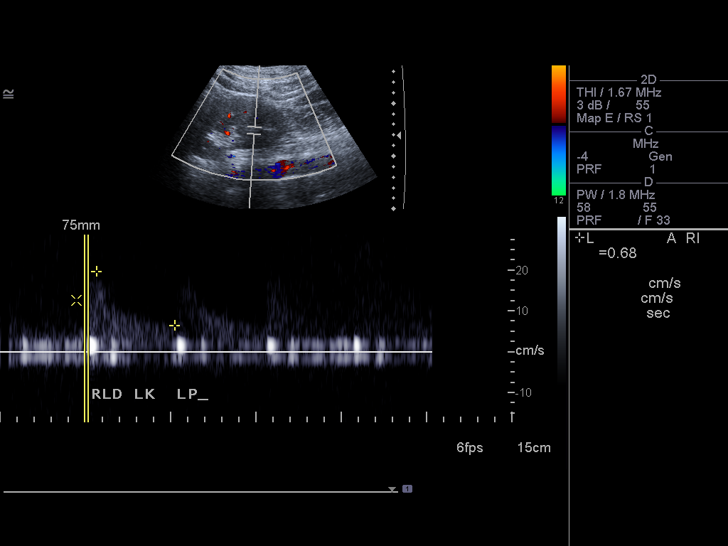

[14 of 25 positions shown; findings below may reference images not displayed]

FINDINGS: RENAL DUPLEX ULTRASOUND

Right Renal Artery Velocities:

Origin:  167 cm/sec

Mid:  144 cm/sec

Hilum:  111 cm/sec

Interlobar:  43 cm/sec

Arcuate:  41 Cm/sec

Left Renal Artery Velocities:

Origin:  117 cm/sec

Mid:  124 cm/sec

Hilum:  53 cm/sec

Interlobar:  40 cm/sec

Arcuate:  32 cm/sec

Aortic Velocity:  147 Cm/sec

Right Renal-Aortic Ratios:

Origin:

Mid:

Hilum:

Interlobar:

Arcuate:

Left Renal-Aortic Ratios:

Origin:

Mid:

Hilum:

Interlobar:

Arcuate:

Antegrade flow recorded in bilateral renal veins.
IMPRESSION: 1. No ultrasound suggestion of renal artery stenosis or other
pathology to suggest a renovascular component of hypertension. If
there is continued clinical concern, renal MRA (lower radiation
risk, can be performed noncontrast in the setting of renal
dysfunction) and CTA ( higher spatial resolution) represent more
accurate studies, which are additionally more sensitive to the
detection of duplicated renal arteries.

## 2016-01-14 ENCOUNTER — Other Ambulatory Visit: Payer: Self-pay | Admitting: Medical

## 2016-09-15 ENCOUNTER — Other Ambulatory Visit: Payer: Self-pay | Admitting: Medical

## 2016-09-23 ENCOUNTER — Telehealth: Payer: Self-pay

## 2016-09-23 NOTE — Telephone Encounter (Signed)
Refill request from [facility] for [facility]atin. Trixie Rude/RLB

## 2016-09-23 NOTE — Telephone Encounter (Signed)
Please send back to pharmacy that this patient has [place] of [place]

## 2016-09-23 NOTE — Telephone Encounter (Signed)
[facility]

## 2016-09-23 NOTE — Telephone Encounter (Signed)
Called spoke with pt he has moved to [place]. [facility].
